# Patient Record
Sex: Female | Born: 1995 | Race: White | Hispanic: No | Marital: Single | Smoking: Never smoker
Health system: Southern US, Community
[De-identification: ages and names within clinical notes are randomized; demographics above are authoritative.]

## PROBLEM LIST (undated history)

## (undated) DIAGNOSIS — G43909 Migraine, unspecified, not intractable, without status migrainosus: Secondary | ICD-10-CM

## (undated) HISTORY — PX: NO PAST SURGERIES: SHX2092

---
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
## 2017-10-24 ENCOUNTER — Other Ambulatory Visit: Payer: Self-pay

## 2017-10-24 ENCOUNTER — Ambulatory Visit
Admission: EM | Admit: 2017-10-24 | Discharge: 2017-10-24 | Disposition: A | Discharge status: Home / Self Care | Payer: BC Managed Care – PPO | Attending: Family Medicine | Admitting: Family Medicine

## 2017-10-24 DIAGNOSIS — H1031 Unspecified acute conjunctivitis, right eye: Secondary | ICD-10-CM | POA: Diagnosis not present

## 2017-10-24 MED ORDER — MOXIFLOXACIN HCL 0.5 % OP SOLN: 1.0000 [drp] | Freq: Three times a day (TID) | OPHTHALMIC | 0 refills | Status: DC

## 2017-10-24 NOTE — ED Triage Notes (Signed)
Patient complains of right eye redness and drainage that started 2 days ago. Patient reports that she recently used a new pair of contacts and forgot to take them out and fell asleep in them and started noticing the blurry vision. Patient states that she took the contacts out and the vision has improved some.

## 2017-10-24 NOTE — ED Provider Notes (Signed)
[facility]    CSN: [id number] Arrival date & time: 10/24/17  1556     History   Chief Complaint Chief Complaint  Patient presents with  . Eye Pain    HPI Jade Chen is a 22 y.o. female.   22 yo female with a 2 days h/o right eye redness and drainage that started after she slept with her contact lenses overnight 3 days ago. Denies any pain. States today felt slightly better.    The history is provided by the patient.  Eye Pain     History reviewed. No pertinent past medical history.  There are no active problems to display for this patient.   Past Surgical History:  Procedure Laterality Date  . NO PAST SURGERIES      OB History    No data available       Home Medications    Prior to Admission medications   Medication Sig Start Date End Date Taking? Authorizing Provider  moxifloxacin (VIGAMOX) 0.5 % ophthalmic solution Place 1 drop into the right eye 3 (three) times daily. 10/24/17   Payton Mccallum, MD    Family History History reviewed. No pertinent family history.  Social History Social History   Tobacco Use  . Smoking status: Never Smoker  . Smokeless tobacco: Never Used  Substance Use Topics  . Alcohol use: Yes    Comment: occasionally  . Drug use: No     Allergies   Patient has no known allergies.   Review of Systems Review of Systems  Eyes: Positive for pain.     Physical Exam Triage Vital Signs ED Triage Vitals  Enc Vitals Group     BP 10/24/17 1652 (!) 119/57     Pulse Rate 10/24/17 1652 70     Resp 10/24/17 1652 18     Temp 10/24/17 1652 98.5 F (36.9 C)     Temp Source 10/24/17 1652 Oral     SpO2 10/24/17 1652 100 %     Weight 10/24/17 1650 117 lb (53.1 kg)     Height 10/24/17 1650 5\' 7"  (1.702 m)     Head Circumference --      Peak Flow --      Pain Score 10/24/17 1650 0     Pain Loc --      Pain Edu? --      Excl. in GC? --    No data found.  Updated Vital Signs BP (!) 119/57 (BP Location:  Right Arm)   Pulse 70   Temp 98.5 F (36.9 C) (Oral)   Resp 18   Ht 5\' 7"  (1.702 m)   Wt 117 lb (53.1 kg)   LMP 10/17/2017   SpO2 100%   BMI 18.32 kg/m   Visual Acuity Right Eye Distance: 20/15(corrected) Left Eye Distance: 20/15(corrected) Bilateral Distance: 20/13(corrected)  Right Eye Near:   Left Eye Near:    Bilateral Near:     Physical Exam  Constitutional: She appears well-developed and well-nourished. No distress.  Eyes: EOM and lids are normal. Pupils are equal, round, and reactive to light. Lids are everted and swept, no foreign bodies found. Right eye exhibits discharge. No foreign body present in the right eye. Right conjunctiva is injected.  Skin: She is not diaphoretic.  Nursing note and vitals reviewed.    UC Treatments / Results  Labs (all labs ordered are listed, but only abnormal results are displayed) Labs Reviewed - No data to display  EKG  EKG  Interpretation None       Radiology No results found.  Procedures Procedures (including critical care time)  Medications Ordered in UC Medications - No data to display   Initial Impression / Assessment and Plan / UC Course  I have reviewed the triage vital signs and the nursing notes.  Pertinent labs & imaging results that were available during my care of the patient were reviewed by me and considered in my medical decision making (see chart for details).       Final Clinical Impressions(s) / UC Diagnoses   Final diagnoses:  Acute bacterial conjunctivitis of right eye    ED Discharge Orders        Ordered    moxifloxacin (VIGAMOX) 0.5 % ophthalmic solution  3 times daily     10/24/17 1800     1. diagnosis reviewed with patient 2. rx as per orders above; reviewed possible side effects, interactions, risks and benefits  3. Recommend supportive treatment with cool compresses; do not wear contact lenses until resolved 4. Follow-up prn if symptoms worsen or don't improve   Controlled  Substance Prescriptions [place] Controlled Substance Registry consulted? Not Applicable   Payton Mccallumonty, Evangelene Vora, MD 10/24/17 [phone]

## 2021-04-14 DIAGNOSIS — G43009 Migraine without aura, not intractable, without status migrainosus: Secondary | ICD-10-CM | POA: Insufficient documentation

## 2021-10-07 ENCOUNTER — Other Ambulatory Visit: Payer: Self-pay | Admitting: Physician Assistant

## 2021-10-07 DIAGNOSIS — R519 Headache, unspecified: Secondary | ICD-10-CM

## 2021-10-07 DIAGNOSIS — M542 Cervicalgia: Secondary | ICD-10-CM

## 2021-10-15 ENCOUNTER — Ambulatory Visit: Payer: BC Managed Care – PPO

## 2021-10-28 ENCOUNTER — Ambulatory Visit
Admission: RE | Admit: 2021-10-28 | Discharge: 2021-10-28 | Disposition: A | Discharge status: Home / Self Care | Payer: BC Managed Care – PPO | Source: Ambulatory Visit | Attending: Physician Assistant | Admitting: Physician Assistant

## 2021-10-28 DIAGNOSIS — R519 Headache, unspecified: Secondary | ICD-10-CM

## 2021-10-28 DIAGNOSIS — M542 Cervicalgia: Secondary | ICD-10-CM

## 2021-10-28 IMAGING — MR MR MRA NECK W/O CM
2 series · 38 of 48 positions shown · non-contrast
Comparison: No pertinent prior exams available for comparison.

CLINICAL DATA: Throbbing headache. Non intractable headache,
unspecified chronicity pattern, unspecified headache type. Neck
pain. Additional history provided by scanning technologist: Patient
reports frequent headaches/migraines since [DATE], associated
sensitivity to light/sounds and smells.

EXAM:
MRI HEAD WITHOUT CONTRAST
MRA HEAD WITHOUT CONTRAST
MRA NECK WITHOUT CONTRAST
TECHNIQUE: Multiplanar, multi-echo pulse sequences of the brain and surrounding
structures were acquired without intravenous contrast. Angiographic
images of the Circle of Willis were acquired using MRA technique
without intravenous contrast. Angiographic images of the neck were
acquired using MRA technique without intravenous contrast. Carotid
stenosis measurements (when applicable) are obtained utilizing
NASCET criteria, using the distal internal carotid diameter as the
denominator.

[Series 5: tof_2d neck non · axial · 3.0mm · 0.65mm/px · z∈[-252,-53]mm · 36 of 105 slices shown]
[im 1/105]
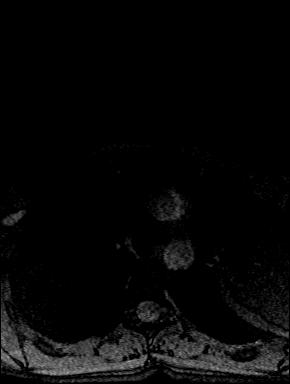
[im 3/105]
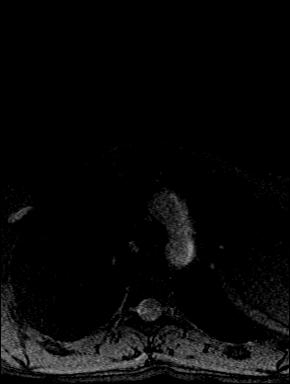
[im 5/105]
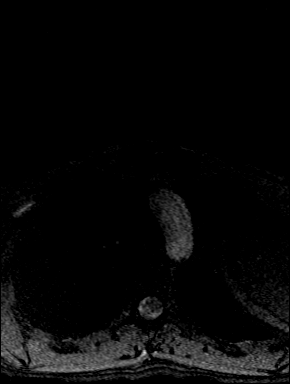
[im 7/105]
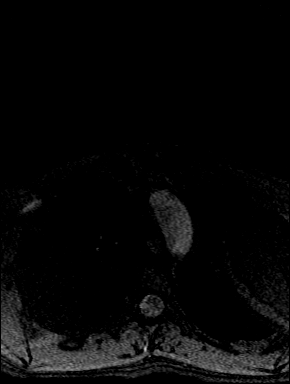
[im 10/105]
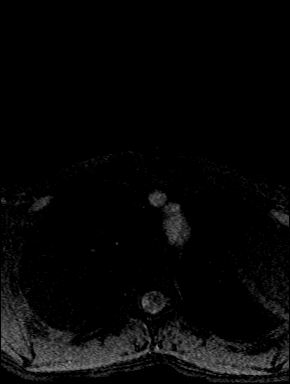
[im 12/105]
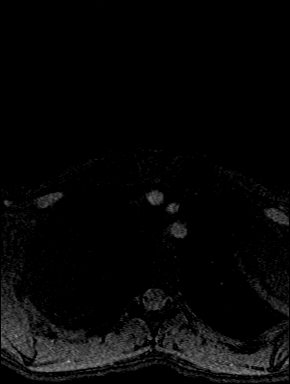
[im 14/105]
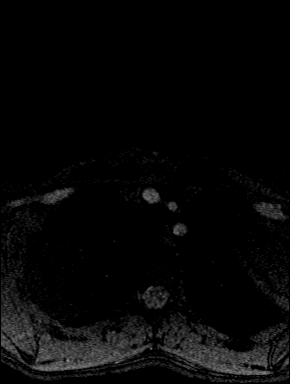
[im 17/105]
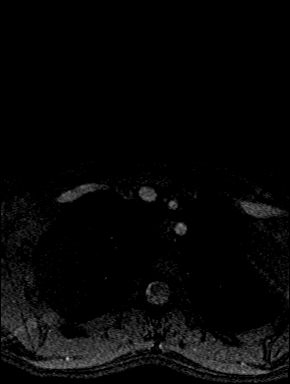
[im 19/105]
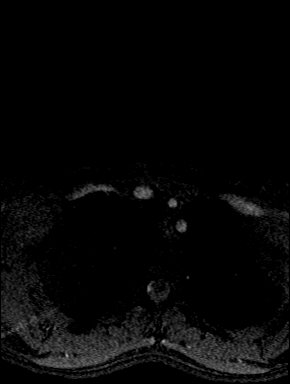
[im 21/105]
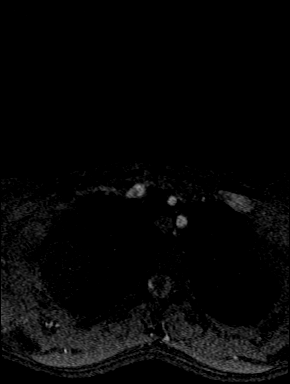
[im 24/105]
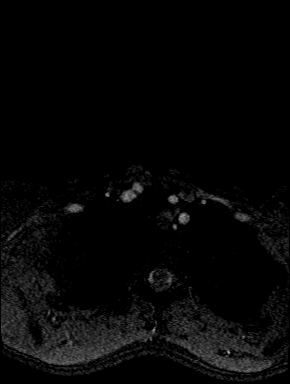
[im 26/105]
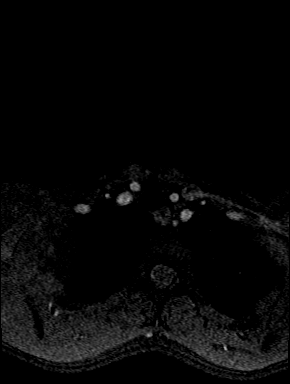
[im 28/105]
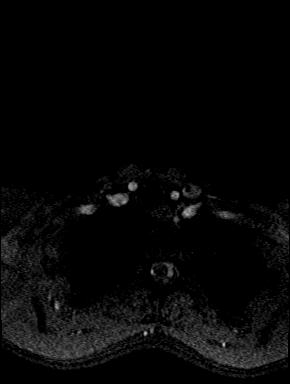
[im 31/105]
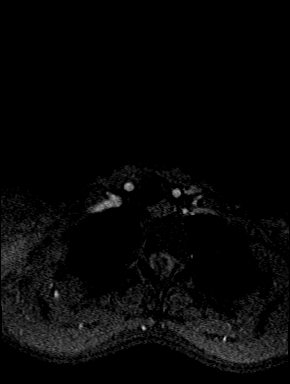
[im 33/105]
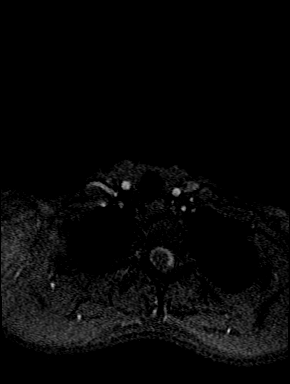
[im 35/105]
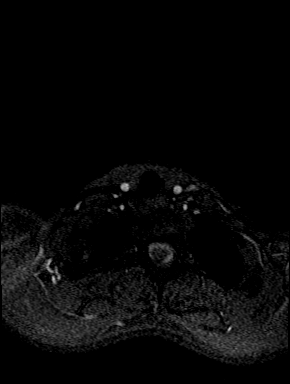
[im 37/105]
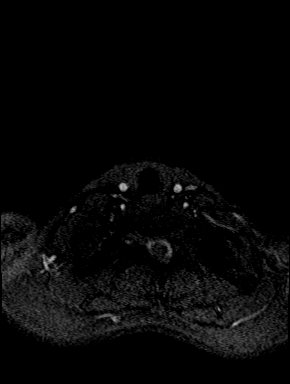
[im 40/105]
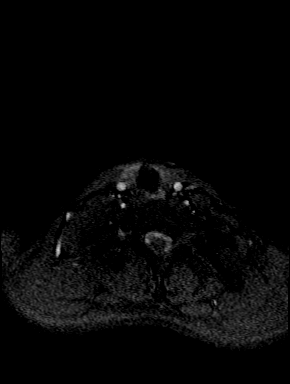
[im 42/105]
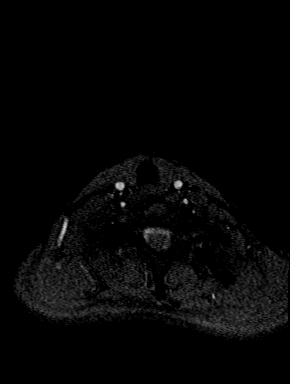
[im 44/105]
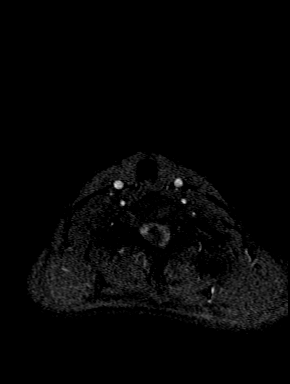
[im 47/105]
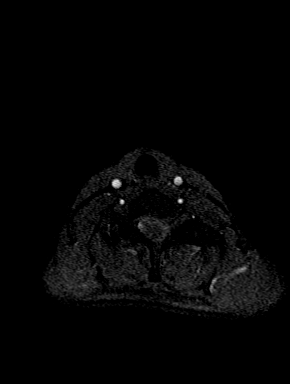
[im 49/105]
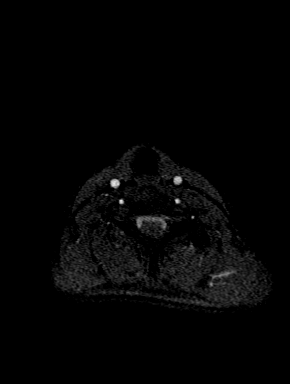
[im 51/105]
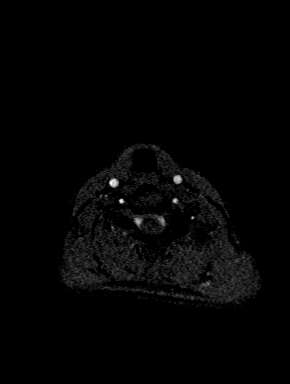
[im 54/105]
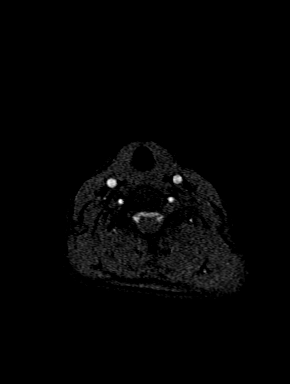
[im 56/105]
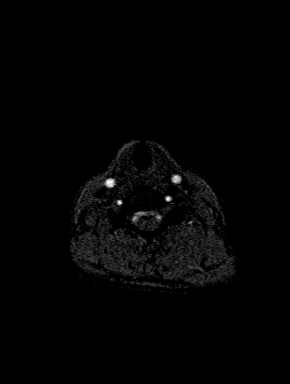
[im 58/105]
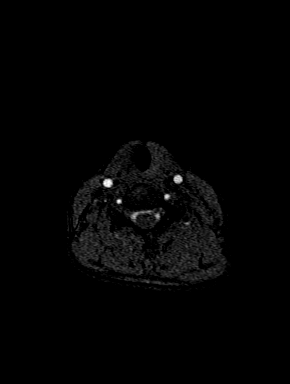
[im 61/105]
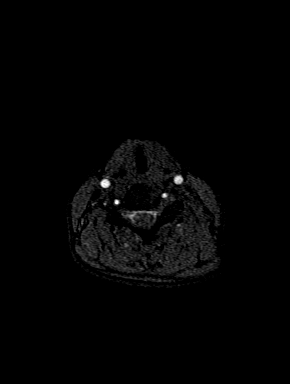
[im 63/105]
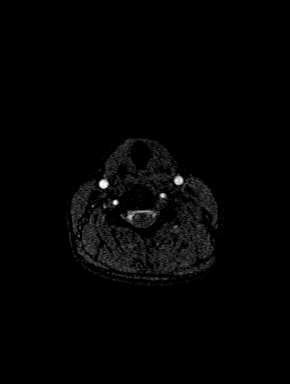
[im 65/105]
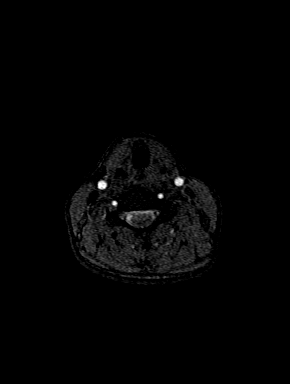
[im 68/105]
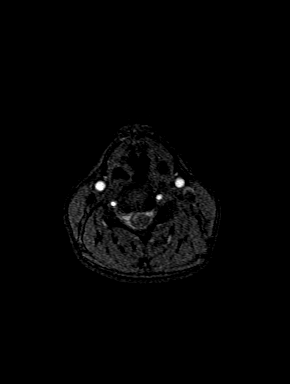
[im 70/105]
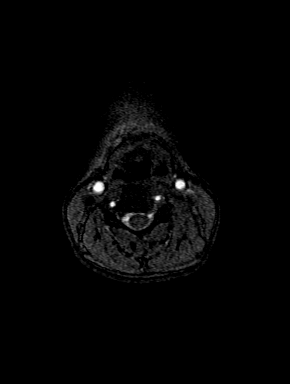
[im 72/105]
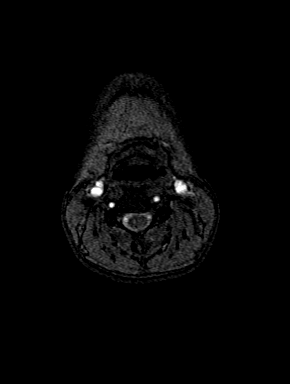
[im 74/105]
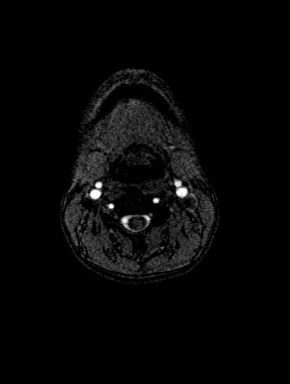
[im 86/105]
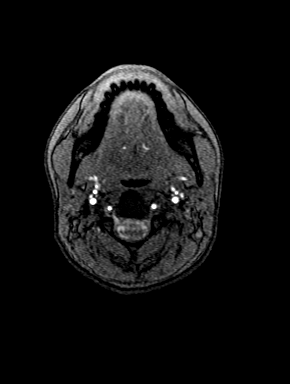
[im 88/105]
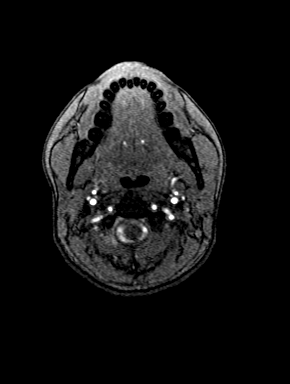
[im 100/105]
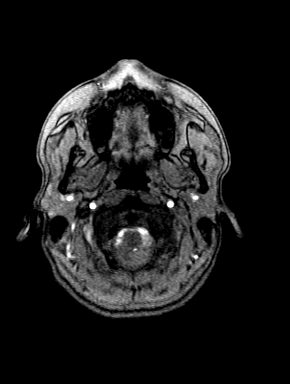

[Series 102: lcca · sagittal · 0.65mm/px · 2 of 4 slices shown]
[im 1/4]
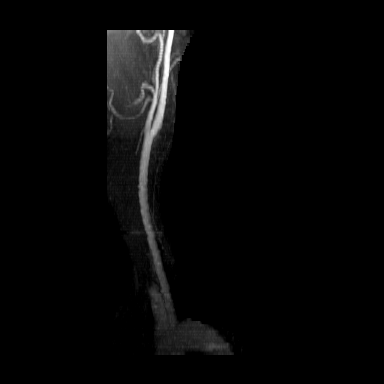
[im 4/4]
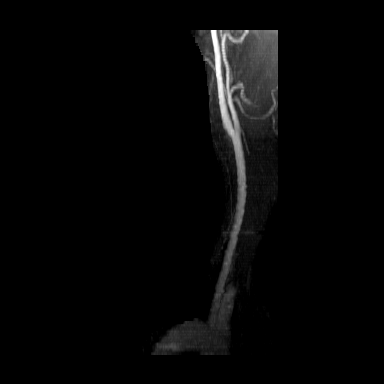

[38 of 48 positions shown; findings below may reference images not displayed]

FINDINGS: MRI HEAD FINDINGS

Brain:

Cerebral volume is normal.

2 mm cystic-appearing focus within the midline posterior pituitary
gland without mass effect or invasion of regional structures (series
11, image 11) (series 16, image 19).

Nonspecific 3 mm round focus of SWI signal loss within the central
pons.

No cortical encephalomalacia is identified. No significant cerebral
white matter disease.

There is no acute infarct.

No evidence of an intracranial mass.

No extra-axial fluid collection.

No midline shift.

Vascular: Maintained flow voids within the proximal large arterial
vessels.

Skull and upper cervical spine: No focal suspicious marrow lesion.

Sinuses/Orbits: Visualized orbits show no acute finding. Trace
scattered paranasal sinus mucosal thickening.

MRA HEAD FINDINGS

Anterior circulation:

The intracranial internal carotid arteries are patent. The M1 middle
cerebral arteries are patent. No M2 proximal branch occlusion or
high-grade proximal stenosis is identified. The anterior cerebral
arteries are patent. No intracranial aneurysm is identified.

Posterior circulation:

The intracranial vertebral arteries are patent. The basilar artery
is patent. The posterior cerebral arteries are patent. Fetal origin
right posterior communicating artery. A left posterior communicating
artery is present.

Anatomic variants: As described.

MRA NECK FINDINGS

Aortic arch: Standard aortic branching. The visualized aortic arch
is normal in caliber.

Right carotid system: The common carotid and internal carotid
arteries are smooth and patent within the neck without appreciable
stenosis.

Left carotid system: The common carotid and internal carotid
arteries are smooth and patent within the neck without appreciable
stenosis.

Vertebral arteries: Codominant and patent within the neck. The
vertebral arteries are smooth without appreciable stenosis.
IMPRESSION: MRI brain:

1. No evidence of acute intracranial abnormality.
2. 3 mm round focus of susceptibility-weighted signal loss within
the central pons, nonspecific but possibly reflecting a chronic
microhemorrhage, focus of mineralization or a low-flow vascular
malformation (such as capillary telangiectasia).
3. 2 mm cyst within the midline posterior pituitary gland. No
further imaging evaluation is necessary. This follows ACR consensus
guidelines: Management of Incidental Pituitary Findings on CT, MRI
and F18-FDG PET: A White Paper of the ACR Incidental Findings
Committee. [HOSPITAL] [XO]; 15: 966-72.
4. Otherwise unremarkable non-contrast MRI appearance of the brain.

MRA head:

1. Unremarkable exam. No intracranial large vessel occlusion or
proximal high-grade arterial stenosis.
2. No intracranial aneurysm identified.

MRA neck:

The common carotid, internal carotid and vertebral arteries are
patent within the neck without appreciable stenosis.

## 2021-10-28 IMAGING — MR MR HEAD W/O CM
11 series · 48 of 48 positions shown · non-contrast
Comparison: No pertinent prior exams available for comparison.

CLINICAL DATA: Throbbing headache. Non intractable headache,
unspecified chronicity pattern, unspecified headache type. Neck
pain. Additional history provided by scanning technologist: Patient
reports frequent headaches/migraines since [DATE], associated
sensitivity to light/sounds and smells.

EXAM:
MRI HEAD WITHOUT CONTRAST
MRA HEAD WITHOUT CONTRAST
MRA NECK WITHOUT CONTRAST
TECHNIQUE: Multiplanar, multi-echo pulse sequences of the brain and surrounding
structures were acquired without intravenous contrast. Angiographic
images of the Circle of Willis were acquired using MRA technique
without intravenous contrast. Angiographic images of the neck were
acquired using MRA technique without intravenous contrast. Carotid
stenosis measurements (when applicable) are obtained utilizing
NASCET criteria, using the distal internal carotid diameter as the
denominator.

[Series 5: T1 · sagittal · 4.0mm · 0.75mm/px · 2 of 31 slices shown (1 of 2)]
[im 1/31]
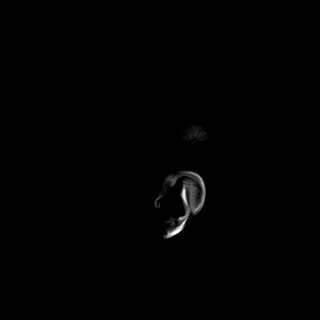
[im 31/31]
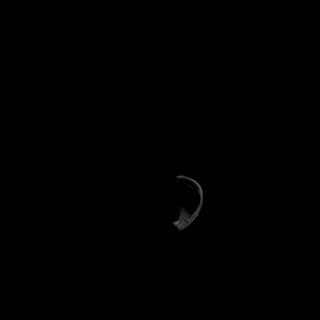

[Series 6: DWI · axial · 3.0mm · 0.94mm/px · z∈[-67,+73]mm · 11 of 160 slices shown (1 of 3)]
[im 1/160]
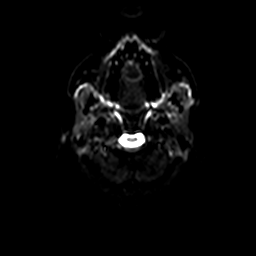
[im 16/160]
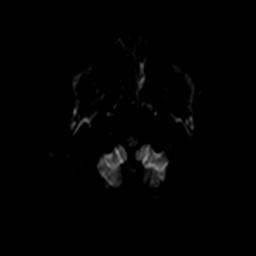
[im 32/160]
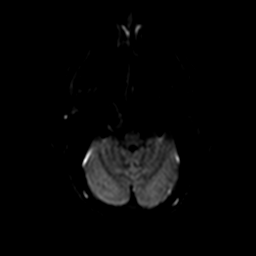
[im 48/160]
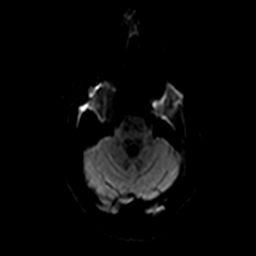
[im 64/160]
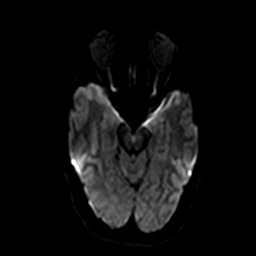
[im 80/160]
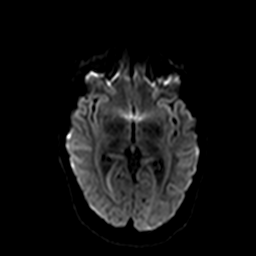
[im 96/160]
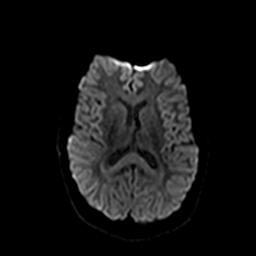
[im 112/160]
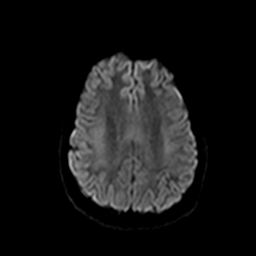
[im 128/160]
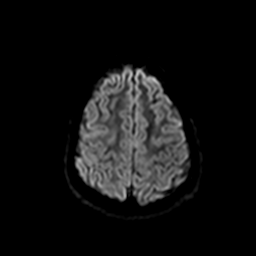
[im 144/160]
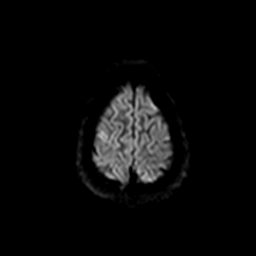
[im 160/160]
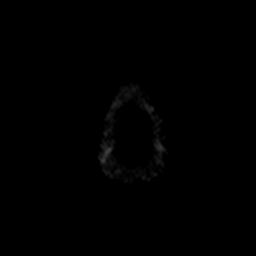

[Series 7: ax dwi_tracew · axial · 3.0mm · 0.94mm/px · z∈[-67,+73]mm · 6 of 80 slices shown]
[im 1/80]
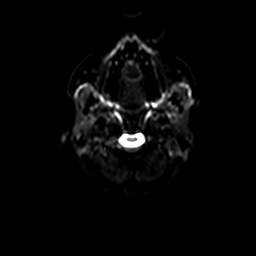
[im 16/80]
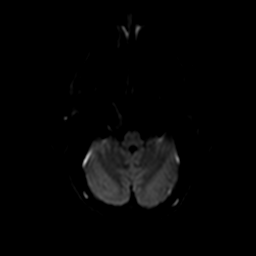
[im 32/80]
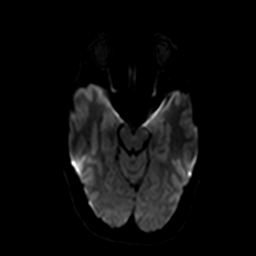
[im 48/80]
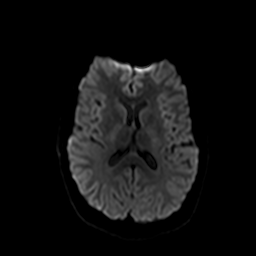
[im 64/80]
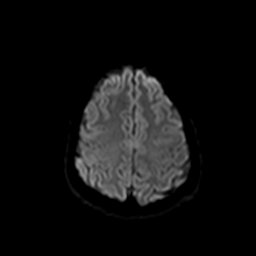
[im 80/80]
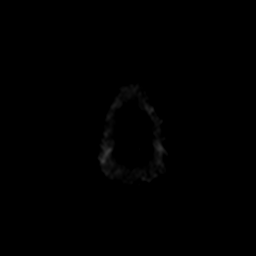

[Series 8: ax dwi_adc · axial · 3.0mm · 0.94mm/px · z∈[-67,+73]mm · 3 of 40 slices shown]
[im 1/40]
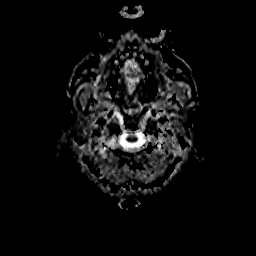
[im 20/40]
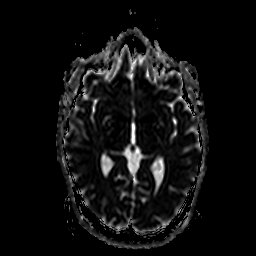
[im 40/40]
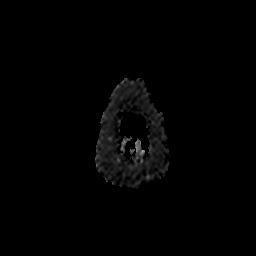

[Series 9: DWI · coronal · 5.0mm · 1.44mm/px · 4 of 60 slices shown (2 of 3)]
[im 1/60]
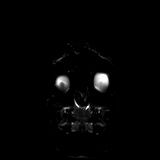
[im 20/60]
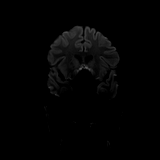
[im 40/60]
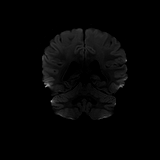
[im 60/60]
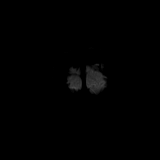

[Series 10: DWI · coronal · 5.0mm · 1.44mm/px · 2 of 30 slices shown (3 of 3)]
[im 1/30]
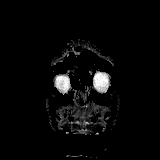
[im 30/30]
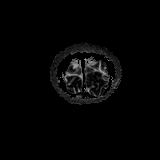

[Series 11: T2 · axial · 4.0mm · 0.36mm/px · z∈[-68,+77]mm · 2 of 29 slices shown (1 of 2)]
[im 1/29]
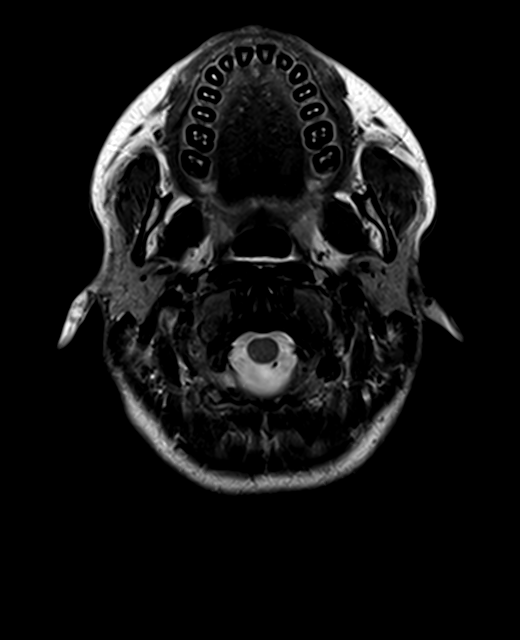
[im 29/29]
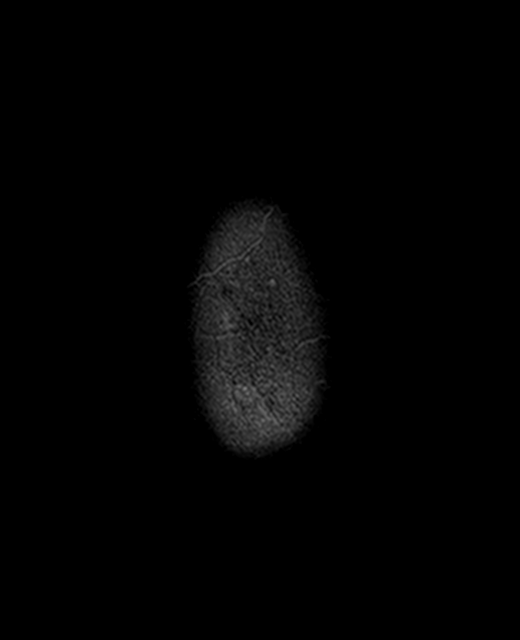

[Series 12: FLAIR · axial · 3.0mm · 0.72mm/px · z∈[-71,+78]mm · 2 of 26 slices shown]
[im 1/26]
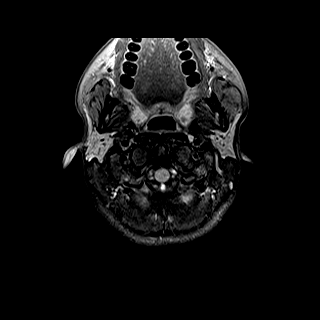
[im 26/26]
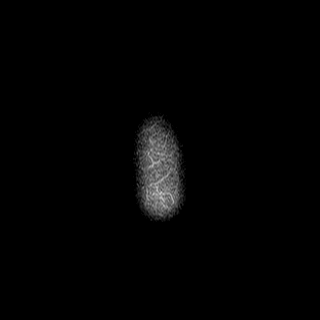

[Series 14: swi_images · axial · 3.0mm · 0.90mm/px · z∈[-72,+81]mm · 4 of 52 slices shown]
[im 1/52]
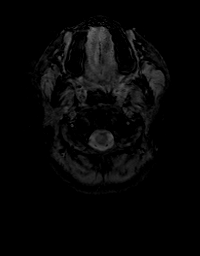
[im 18/52]
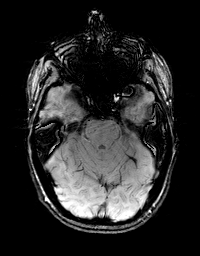
[im 35/52]
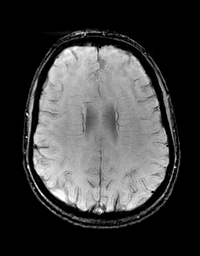
[im 52/52]
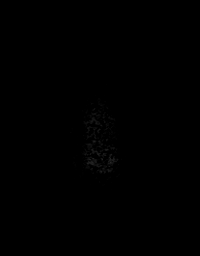

[Series 15: T1 · axial · 1.0mm · 0.90mm/px · z∈[-69,+73]mm · 10 of 144 slices shown (2 of 2)]
[im 1/144]
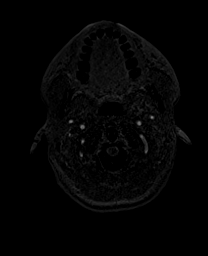
[im 16/144]
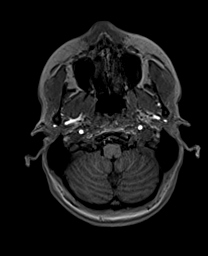
[im 32/144]
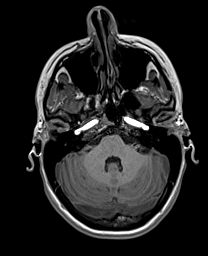
[im 48/144]
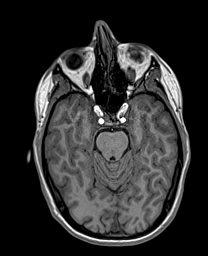
[im 64/144]
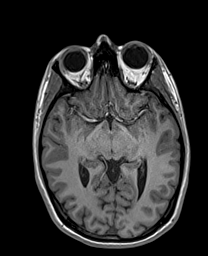
[im 80/144]
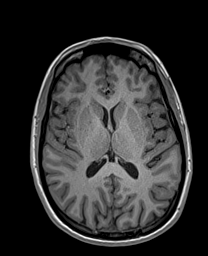
[im 96/144]
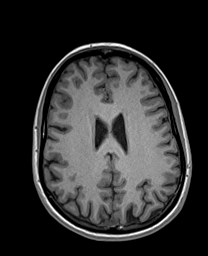
[im 112/144]
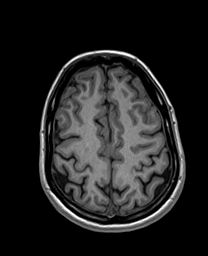
[im 128/144]
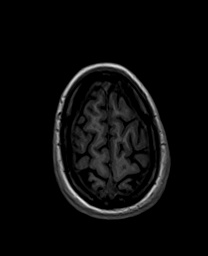
[im 144/144]
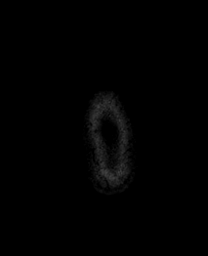

[Series 16: T2 · coronal · 4.5mm · 0.36mm/px · 2 of 30 slices shown (2 of 2)]
[im 1/30]
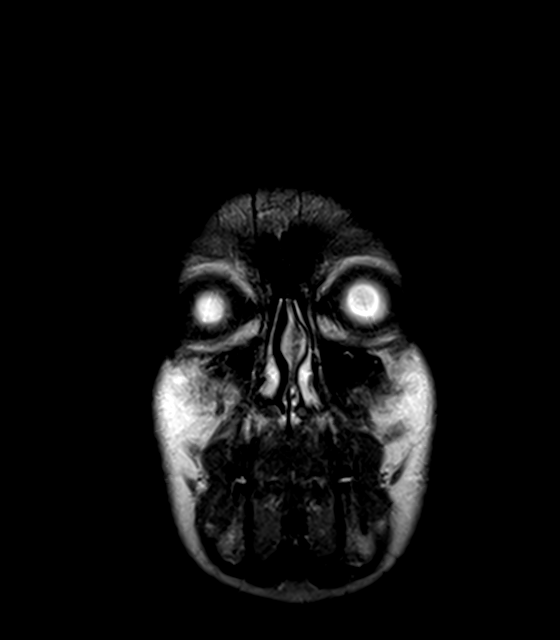
[im 30/30]
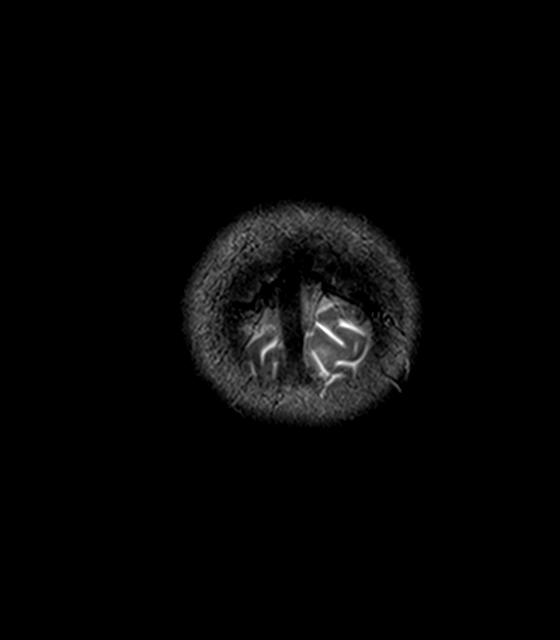

[48 of 48 positions shown; findings below may reference images not displayed]

FINDINGS: MRI HEAD FINDINGS

Brain:

Cerebral volume is normal.

2 mm cystic-appearing focus within the midline posterior pituitary
gland without mass effect or invasion of regional structures (series
11, image 11) (series 16, image 19).

Nonspecific 3 mm round focus of SWI signal loss within the central
pons.

No cortical encephalomalacia is identified. No significant cerebral
white matter disease.

There is no acute infarct.

No evidence of an intracranial mass.

No extra-axial fluid collection.

No midline shift.

Vascular: Maintained flow voids within the proximal large arterial
vessels.

Skull and upper cervical spine: No focal suspicious marrow lesion.

Sinuses/Orbits: Visualized orbits show no acute finding. Trace
scattered paranasal sinus mucosal thickening.

MRA HEAD FINDINGS

Anterior circulation:

The intracranial internal carotid arteries are patent. The M1 middle
cerebral arteries are patent. No M2 proximal branch occlusion or
high-grade proximal stenosis is identified. The anterior cerebral
arteries are patent. No intracranial aneurysm is identified.

Posterior circulation:

The intracranial vertebral arteries are patent. The basilar artery
is patent. The posterior cerebral arteries are patent. Fetal origin
right posterior communicating artery. A left posterior communicating
artery is present.

Anatomic variants: As described.

MRA NECK FINDINGS

Aortic arch: Standard aortic branching. The visualized aortic arch
is normal in caliber.

Right carotid system: The common carotid and internal carotid
arteries are smooth and patent within the neck without appreciable
stenosis.

Left carotid system: The common carotid and internal carotid
arteries are smooth and patent within the neck without appreciable
stenosis.

Vertebral arteries: Codominant and patent within the neck. The
vertebral arteries are smooth without appreciable stenosis.
IMPRESSION: MRI brain:

1. No evidence of acute intracranial abnormality.
2. 3 mm round focus of susceptibility-weighted signal loss within
the central pons, nonspecific but possibly reflecting a chronic
microhemorrhage, focus of mineralization or a low-flow vascular
malformation (such as capillary telangiectasia).
3. 2 mm cyst within the midline posterior pituitary gland. No
further imaging evaluation is necessary. This follows ACR consensus
guidelines: Management of Incidental Pituitary Findings on CT, MRI
and F18-FDG PET: A White Paper of the ACR Incidental Findings
Committee. [HOSPITAL] [XO]; 15: 966-72.
4. Otherwise unremarkable non-contrast MRI appearance of the brain.

MRA head:

1. Unremarkable exam. No intracranial large vessel occlusion or
proximal high-grade arterial stenosis.
2. No intracranial aneurysm identified.

MRA neck:

The common carotid, internal carotid and vertebral arteries are
patent within the neck without appreciable stenosis.

## 2021-10-28 IMAGING — MR MR MRA HEAD W/O CM
1 series · 10 of 48 positions shown · non-contrast
Comparison: No pertinent prior exams available for comparison.

CLINICAL DATA: Throbbing headache. Non intractable headache,
unspecified chronicity pattern, unspecified headache type. Neck
pain. Additional history provided by scanning technologist: Patient
reports frequent headaches/migraines since [DATE], associated
sensitivity to light/sounds and smells.

EXAM:
MRI HEAD WITHOUT CONTRAST
MRA HEAD WITHOUT CONTRAST
MRA NECK WITHOUT CONTRAST
TECHNIQUE: Multiplanar, multi-echo pulse sequences of the brain and surrounding
structures were acquired without intravenous contrast. Angiographic
images of the Circle of Willis were acquired using MRA technique
without intravenous contrast. Angiographic images of the neck were
acquired using MRA technique without intravenous contrast. Carotid
stenosis measurements (when applicable) are obtained utilizing
NASCET criteria, using the distal internal carotid diameter as the
denominator.

[Series 4: tof_fl3d_tra_p2_multi-slab · axial · 0.6mm · 0.26mm/px · z∈[-51,+19]mm · 10 of 149 slices shown]
[im 10/149]
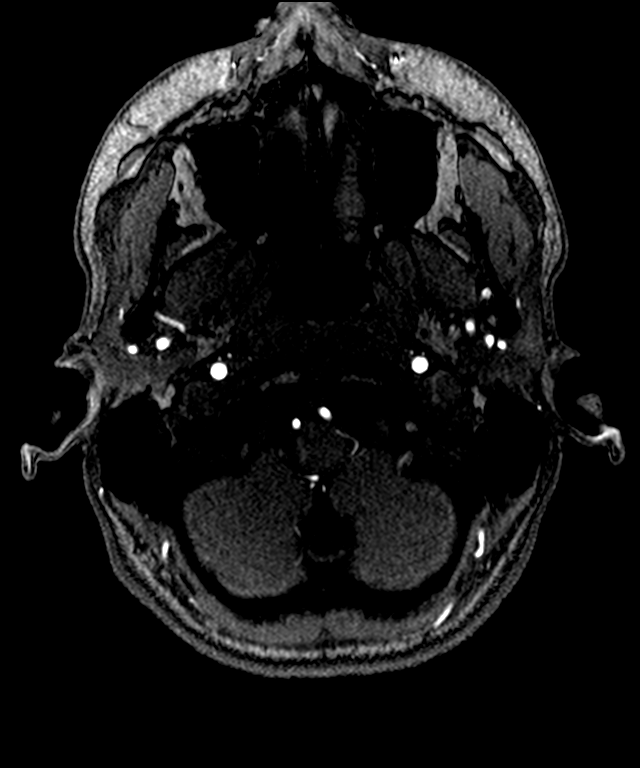
[im 26/149]
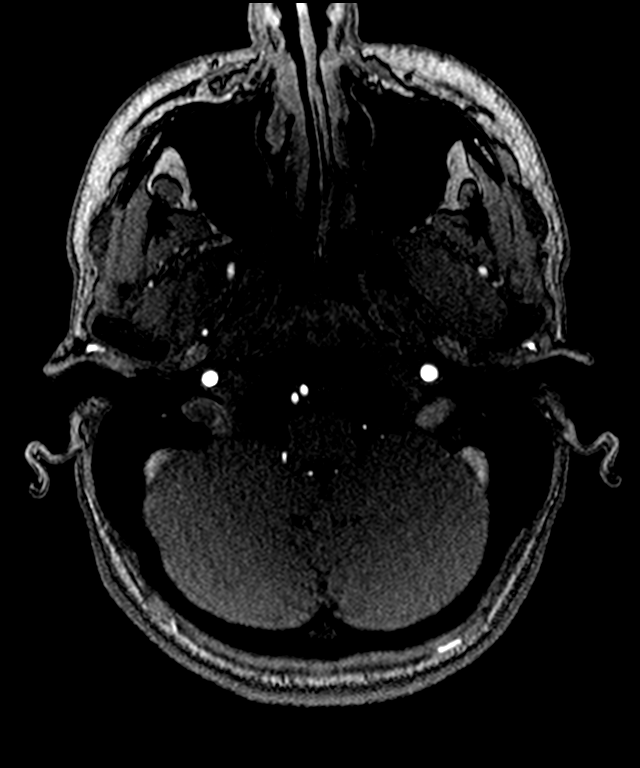
[im 29/149]
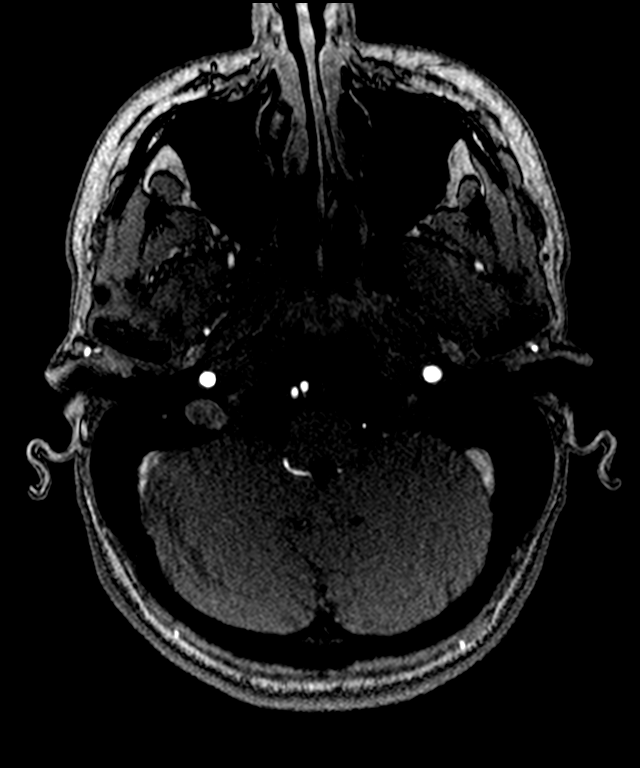
[im 48/149]
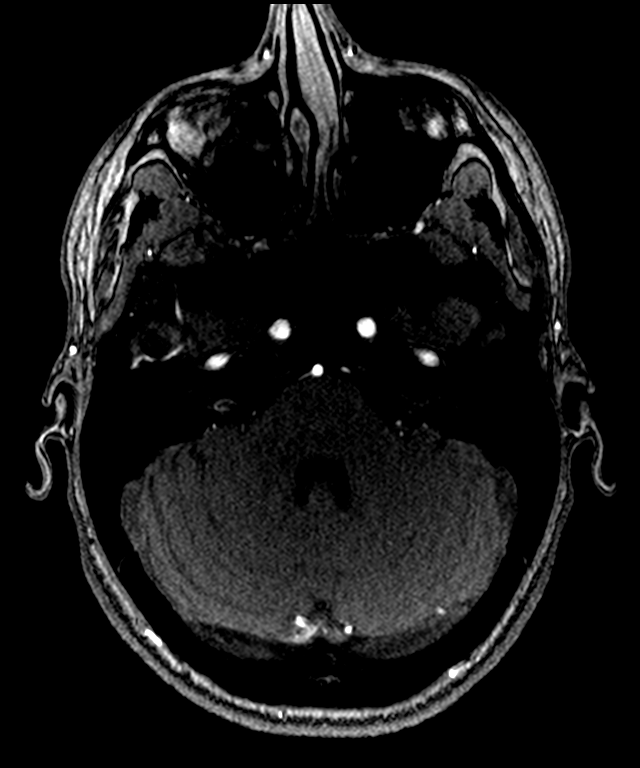
[im 67/149]
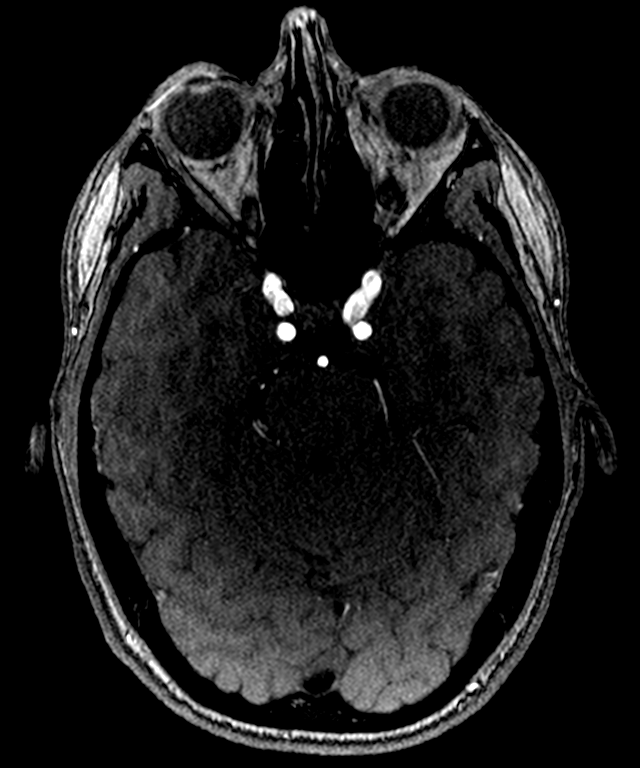
[im 76/149]
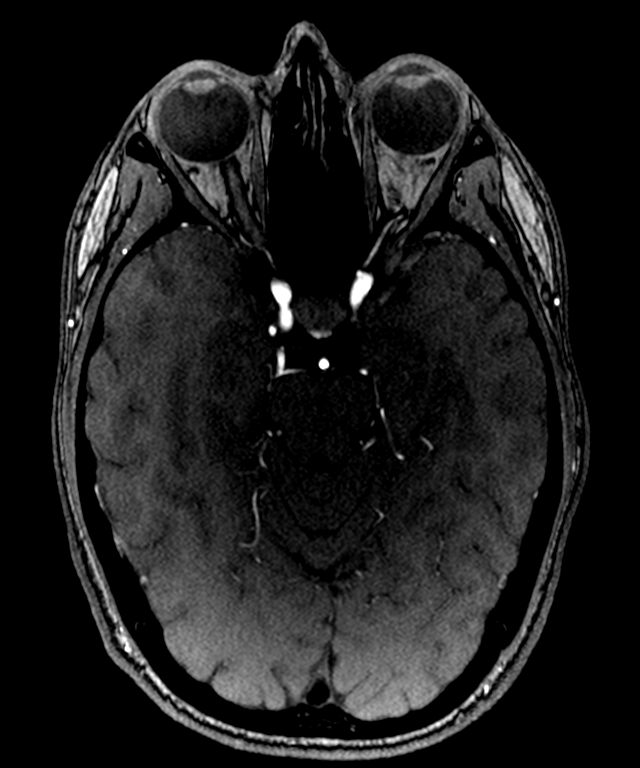
[im 86/149]
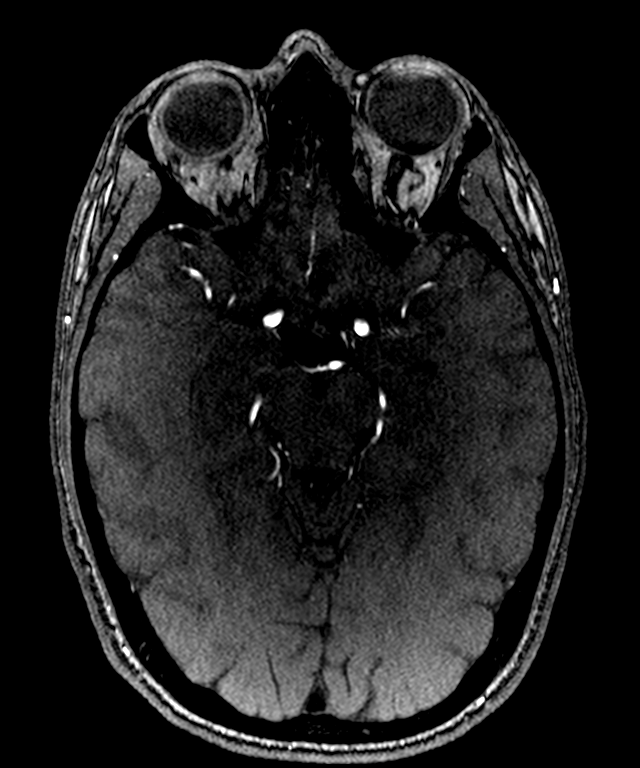
[im 104/149]
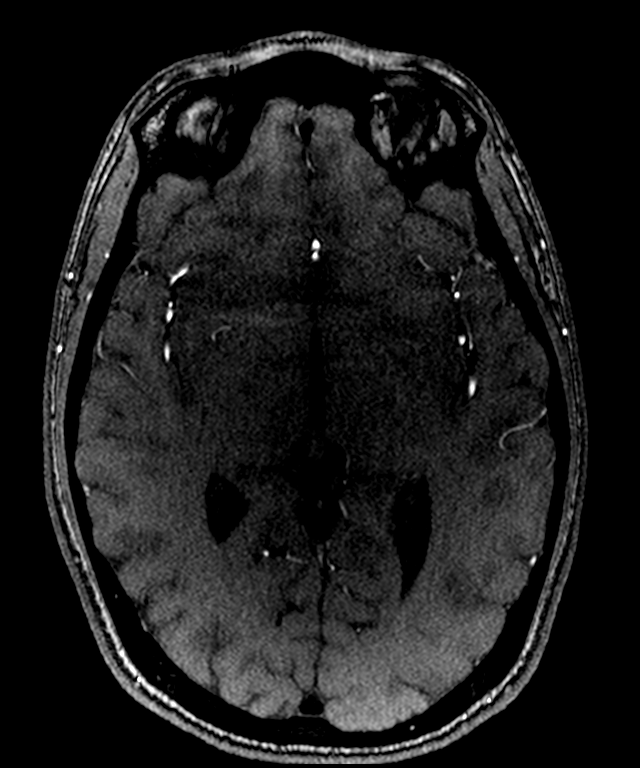
[im 123/149]
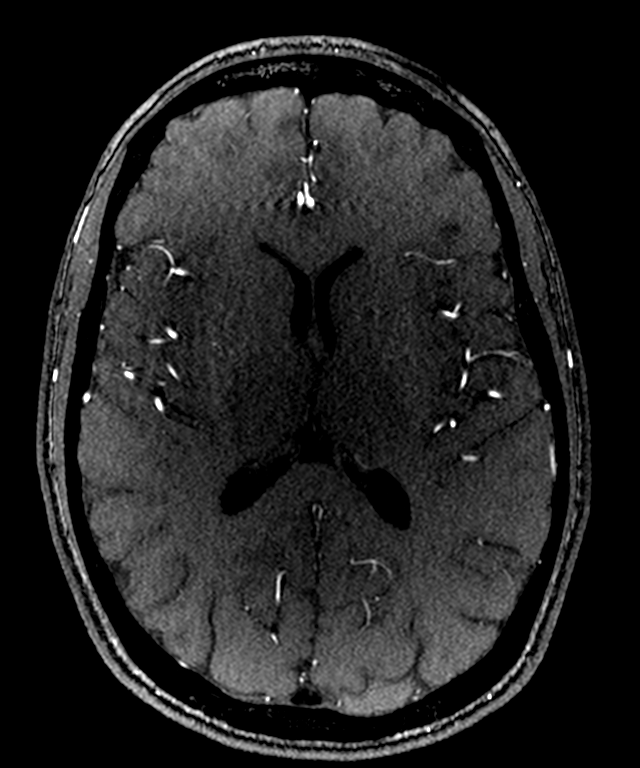
[im 126/149]
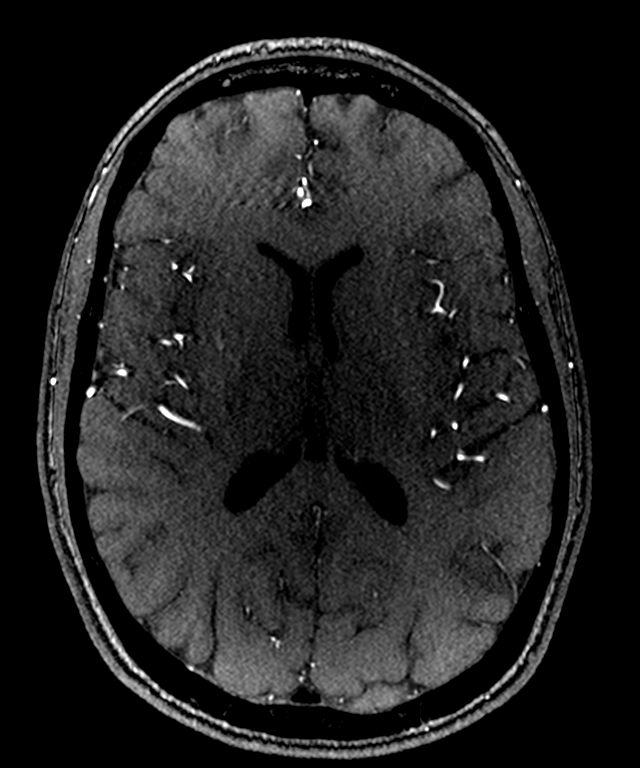

[10 of 48 positions shown; findings below may reference images not displayed]

FINDINGS: MRI HEAD FINDINGS

Brain:

Cerebral volume is normal.

2 mm cystic-appearing focus within the midline posterior pituitary
gland without mass effect or invasion of regional structures (series
11, image 11) (series 16, image 19).

Nonspecific 3 mm round focus of SWI signal loss within the central
pons.

No cortical encephalomalacia is identified. No significant cerebral
white matter disease.

There is no acute infarct.

No evidence of an intracranial mass.

No extra-axial fluid collection.

No midline shift.

Vascular: Maintained flow voids within the proximal large arterial
vessels.

Skull and upper cervical spine: No focal suspicious marrow lesion.

Sinuses/Orbits: Visualized orbits show no acute finding. Trace
scattered paranasal sinus mucosal thickening.

MRA HEAD FINDINGS

Anterior circulation:

The intracranial internal carotid arteries are patent. The M1 middle
cerebral arteries are patent. No M2 proximal branch occlusion or
high-grade proximal stenosis is identified. The anterior cerebral
arteries are patent. No intracranial aneurysm is identified.

Posterior circulation:

The intracranial vertebral arteries are patent. The basilar artery
is patent. The posterior cerebral arteries are patent. Fetal origin
right posterior communicating artery. A left posterior communicating
artery is present.

Anatomic variants: As described.

MRA NECK FINDINGS

Aortic arch: Standard aortic branching. The visualized aortic arch
is normal in caliber.

Right carotid system: The common carotid and internal carotid
arteries are smooth and patent within the neck without appreciable
stenosis.

Left carotid system: The common carotid and internal carotid
arteries are smooth and patent within the neck without appreciable
stenosis.

Vertebral arteries: Codominant and patent within the neck. The
vertebral arteries are smooth without appreciable stenosis.
IMPRESSION: MRI brain:

1. No evidence of acute intracranial abnormality.
2. 3 mm round focus of susceptibility-weighted signal loss within
the central pons, nonspecific but possibly reflecting a chronic
microhemorrhage, focus of mineralization or a low-flow vascular
malformation (such as capillary telangiectasia).
3. 2 mm cyst within the midline posterior pituitary gland. No
further imaging evaluation is necessary. This follows ACR consensus
guidelines: Management of Incidental Pituitary Findings on CT, MRI
and F18-FDG PET: A White Paper of the ACR Incidental Findings
Committee. [HOSPITAL] [XO]; 15: 966-72.
4. Otherwise unremarkable non-contrast MRI appearance of the brain.

MRA head:

1. Unremarkable exam. No intracranial large vessel occlusion or
proximal high-grade arterial stenosis.
2. No intracranial aneurysm identified.

MRA neck:

The common carotid, internal carotid and vertebral arteries are
patent within the neck without appreciable stenosis.

## 2021-11-09 ENCOUNTER — Other Ambulatory Visit
Admission: RE | Admit: 2021-11-09 | Discharge: 2021-11-09 | Disposition: A | Discharge status: Home / Self Care | Payer: BC Managed Care – PPO | Source: Ambulatory Visit | Attending: Student | Admitting: Student

## 2021-11-09 DIAGNOSIS — R0789 Other chest pain: Secondary | ICD-10-CM | POA: Diagnosis present

## 2021-11-09 LAB — D-DIMER, QUANTITATIVE: D-Dimer, Quant: <0.27 ug/mL-FEU (ref 0.00–0.50)

## 2022-08-24 ENCOUNTER — Other Ambulatory Visit: Payer: Self-pay | Admitting: Student

## 2022-08-24 DIAGNOSIS — R0789 Other chest pain: Secondary | ICD-10-CM

## 2022-08-26 ENCOUNTER — Ambulatory Visit
Admission: RE | Admit: 2022-08-26 | Discharge: 2022-08-26 | Disposition: A | Discharge status: Home / Self Care | Payer: Self-pay | Source: Ambulatory Visit | Attending: Student | Admitting: Student

## 2022-08-26 DIAGNOSIS — R0789 Other chest pain: Secondary | ICD-10-CM | POA: Insufficient documentation

## 2022-10-27 ENCOUNTER — Ambulatory Visit
Admission: RE | Admit: 2022-10-27 | Discharge: 2022-10-27 | Disposition: A | Discharge status: Home / Self Care | Payer: 59 | Source: Ambulatory Visit | Attending: Family Medicine | Admitting: Family Medicine

## 2022-10-27 VITALS — BP 117/78 | HR 63 | Temp 98.7°F | Resp 16 | Ht 67.0 in | Wt 120.0 lb

## 2022-10-27 DIAGNOSIS — N3001 Acute cystitis with hematuria: Secondary | ICD-10-CM

## 2022-10-27 DIAGNOSIS — N3000 Acute cystitis without hematuria: Secondary | ICD-10-CM | POA: Diagnosis not present

## 2022-10-27 DIAGNOSIS — R109 Unspecified abdominal pain: Secondary | ICD-10-CM | POA: Diagnosis not present

## 2022-10-27 HISTORY — DX: Migraine, unspecified, not intractable, without status migrainosus: G43.909

## 2022-10-27 LAB — URINALYSIS, W/ REFLEX TO CULTURE (INFECTION SUSPECTED)
Bilirubin Urine: NEGATIVE
Glucose, UA: NEGATIVE mg/dL
Ketones, ur: NEGATIVE mg/dL
Nitrite: NEGATIVE
Specific Gravity, Urine: 1.015 (ref 1.005–1.030)
pH: 7 (ref 5.0–8.0)

## 2022-10-27 NOTE — ED Triage Notes (Signed)
Pt c/o abd pain, states she got prescribed macrobid by [facility] due to urine cx coming back + on 1/30 but I have now developed intense pain on my right side. I am concerned that this could be kidney infection in addition to the UTI that may require separate/different treatment than the meds I was prescribed - Entered by patient

## 2022-10-27 NOTE — Discharge Instructions (Addendum)
You have a urinary tract infection. Continue taking your antibiotics.

## 2022-10-27 NOTE — ED Provider Notes (Signed)
[facility]    CSN: [id number] Arrival date & time: 10/27/22  1740      History   Chief Complaint Chief Complaint  Patient presents with   Abdominal Pain    I got prescribed medicine for a UTI on 1/30 but I have now developed intense pain on my right side. I am concerned that this could be kidney infection in addition to the UTI that may require separate/different treatment than the meds I was presc - Entered by patient   Flank Pain     HPI HPI Jade Chen is a 27 y.o. female.    Jade Chen presents for right lower side pain that started Monday night. She is currently being treated for a UTI.  She is not currently pregnant. Taking Macrobid prescribed by her GYN/OB. States she can only see a pregnancy test in MyChart.  States she Google'd her symptoms and is concerned she may be having a kidney infection therefore came to the urgent care.  There has been no fever, chills, nausea or vomiting. Patient's last menstrual period was 10/23/2022 (approximate).      Past Medical History:  Diagnosis Date   Migraine     There are no problems to display for this patient.   Past Surgical History:  Procedure Laterality Date   NO PAST SURGERIES      OB History   No obstetric history on file.      Home Medications    Prior to Admission medications   Medication Sig Start Date End Date Taking? Authorizing Provider  nitrofurantoin, macrocrystal-monohydrate, (MACROBID) 100 MG capsule Take by mouth. 10/26/22 10/31/22 Yes [provider]  norethindrone (MICRONOR) 0.35 MG tablet Take 1 tablet by mouth daily.   Yes [provider]  SUMAtriptan (IMITREX) 50 MG tablet Take by mouth.   Yes [provider]  moxifloxacin (VIGAMOX) 0.5 % ophthalmic solution Place 1 drop into the right eye 3 (three) times daily. 10/24/17   Norval Gable, MD    Family History History reviewed. No pertinent family history.  Social History Social History    Tobacco Use   Smoking status: Never   Smokeless tobacco: Never  Vaping Use   Vaping Use: Some days  Substance Use Topics   Alcohol use: Yes    Comment: occasionally   Drug use: No     Allergies   Patient has no known allergies.   Review of Systems Review of Systems: :negative unless otherwise stated in HPI.      Physical Exam Triage Vital Signs ED Triage Vitals  Enc Vitals Group     BP 10/27/22 1750 117/78     Pulse Rate 10/27/22 1750 63     Resp 10/27/22 1750 16     Temp 10/27/22 1750 98.7 F (37.1 C)     Temp Source 10/27/22 1750 Oral     SpO2 10/27/22 1750 98 %     Weight 10/27/22 1748 120 lb (54.4 kg)     Height 10/27/22 1748 5\' 7"  (1.702 m)     Head Circumference --      Peak Flow --      Pain Score 10/27/22 1748 5     Pain Loc --      Pain Edu? --      Excl. in [place]? --    No data found.  Updated Vital Signs BP 117/78 (BP Location: Left Arm)   Pulse 63   Temp 98.7 F (37.1 C) (Oral)  Resp 16   Ht 5\' 7"  (1.702 m)   Wt 54.4 kg   LMP 10/23/2022 (Approximate)   SpO2 98%   BMI 18.79 kg/m   Visual Acuity Right Eye Distance:   Left Eye Distance:   Bilateral Distance:    Right Eye Near:   Left Eye Near:    Bilateral Near:     Physical Exam GEN: well appearing female in no acute distress  CVS: well perfused, regular rate RESP: speaking in full sentences without pause  ABD: soft, non-tender, non-distended, no palpable masses, right CVA tenderness   SKIN: warm, dry, no flank overlying skin changes    UC Treatments / Results  Labs (all labs ordered are listed, but only abnormal results are displayed) Labs Reviewed  URINALYSIS, W/ REFLEX TO CULTURE (INFECTION SUSPECTED) - Abnormal; Notable for the following components:      Result Value   Hgb urine dipstick MODERATE (*)    Protein, ur TRACE (*)    Leukocytes,Ua TRACE (*)    Bacteria, UA FEW (*)    All other components within normal limits    EKG   Radiology No results  found.  Procedures Procedures (including critical care time)  Medications Ordered in UC Medications - No data to display  Initial Impression / Assessment and Plan / UC Course  I have reviewed the triage vital signs and the nursing notes.  Pertinent labs & imaging results that were available during my care of the patient were reviewed by me and considered in my medical decision making (see chart for details).      Patient is a 27 y.o.Marland Kitchen female  who presents for 2 days of urinary symptoms.  Overall patient is well-appearing and afebrile.  Vital signs stable. UA consistent with acute cystitis.  Hematuria supported on microscopy. Continue Macrobid. Urine culture didn't reflex as she has <10 WBCs.  Return precautions including abdominal pain, fever, chills, nausea, or vomiting given.   Discussed MDM, treatment plan and plan for follow-up with patient who agrees with plan.        Final Clinical Impressions(s) / UC Diagnoses   Final diagnoses:  Right flank pain  Acute cystitis with hematuria     Discharge Instructions      You have a urinary tract infection. Continue taking your antibiotics.      ED Prescriptions   None    PDMP not reviewed this encounter.   Lyndee Hensen, DO 10/27/22 2040

## 2023-02-21 ENCOUNTER — Ambulatory Visit
Admission: EM | Admit: 2023-02-21 | Discharge: 2023-02-21 | Disposition: A | Discharge status: Home / Self Care | Payer: 59 | Attending: Family Medicine | Admitting: Family Medicine

## 2023-02-21 DIAGNOSIS — N3001 Acute cystitis with hematuria: Secondary | ICD-10-CM

## 2023-02-21 LAB — URINALYSIS, W/ REFLEX TO CULTURE (INFECTION SUSPECTED)
Bilirubin Urine: NEGATIVE
Glucose, UA: NEGATIVE mg/dL
Ketones, ur: NEGATIVE mg/dL
Nitrite: NEGATIVE
Protein, ur: NEGATIVE mg/dL
Specific Gravity, Urine: 1.02 (ref 1.005–1.030)
WBC, UA: >50 WBC/hpf (ref 0–5)
pH: 7 (ref 5.0–8.0)

## 2023-02-21 MED ORDER — CEPHALEXIN 500 MG PO CAPS: 500.0000 mg | ORAL_CAPSULE | Freq: Four times a day (QID) | ORAL | 0 refills | Status: DC

## 2023-02-21 NOTE — ED Triage Notes (Signed)
Pt c/o urinary freq,burning,pain & R flank pain since this AM. Denies any hematuria.

## 2023-02-21 NOTE — ED Provider Notes (Signed)
[facility]    CSN: [id number] Arrival date & time: 02/21/23  1608      History   Chief Complaint Chief Complaint  Patient presents with   Dysuria   Flank Pain          HPI HPI Jade Chen is a 27 y.o. female.    Jade Chen presents for buringing with urination that started this morning. Has urinary frequency but only small amounts of urine come out. She had similar sx when she had a UTI earlier this year.      Past Medical History:  Diagnosis Date   Migraine     There are no problems to display for this patient.   Past Surgical History:  Procedure Laterality Date   NO PAST SURGERIES      OB History   No obstetric history on file.      Home Medications    Prior to Admission medications   Medication Sig Start Date End Date Taking? Authorizing Provider  norethindrone (MICRONOR) 0.35 MG tablet Take 1 tablet by mouth daily.   Yes [provider]  SUMAtriptan (IMITREX) 50 MG tablet Take by mouth.   Yes [provider]  cephALEXin (KEFLEX) 500 MG capsule Take 1 capsule (500 mg total) by mouth 4 (four) times daily. 02/21/23   Katha Cabal, DO    Family History History reviewed. No pertinent family history.  Social History Social History   Tobacco Use   Smoking status: Never   Smokeless tobacco: Never  Vaping Use   Vaping Use: Some days  Substance Use Topics   Alcohol use: Yes    Comment: occasionally   Drug use: No     Allergies   Patient has no known allergies.   Review of Systems Review of Systems: :negative unless otherwise stated in HPI.      Physical Exam Triage Vital Signs ED Triage Vitals  Enc Vitals Group     BP 02/21/23 1708 106/73     Pulse Rate 02/21/23 1708 (!) 115     Resp 02/21/23 1708 16     Temp 02/21/23 1708 98.5 F (36.9 C)     Temp Source 02/21/23 1708 Oral     SpO2 02/21/23 1708 100 %     Weight 02/21/23 1708 120 lb (54.4 kg)     Height 02/21/23 1708 5\' 7"  (1.702 m)      Head Circumference --      Peak Flow --      Pain Score 02/21/23 1711 0     Pain Loc --      Pain Edu? --      Excl. in [place]? --    No data found.  Updated Vital Signs BP 106/73 (BP Location: Left Arm)   Pulse (!) 115   Temp 98.5 F (36.9 C) (Oral)   Resp 16   Ht 5\' 7"  (1.702 m)   Wt 54.4 kg   SpO2 100%   BMI 18.79 kg/m   Visual Acuity Right Eye Distance:   Left Eye Distance:   Bilateral Distance:    Right Eye Near:   Left Eye Near:    Bilateral Near:     Physical Exam GEN: well appearing female in no acute distress  CVS: well perfused  RESP: speaking in full sentences without pause  ABD: soft, non-tender, non-distended, no palpable masses     UC Treatments / Results  Labs (all labs ordered are listed, but only abnormal results are  displayed) Labs Reviewed  URINALYSIS, W/ REFLEX TO CULTURE (INFECTION SUSPECTED) - Abnormal; Notable for the following components:      Result Value   APPearance HAZY (*)    Hgb urine dipstick MODERATE (*)    Leukocytes,Ua MODERATE (*)    Bacteria, UA MANY (*)    All other components within normal limits  URINE CULTURE    EKG   Radiology No results found.  Procedures Procedures (including critical care time)  Medications Ordered in UC Medications - No data to display  Initial Impression / Assessment and Plan / UC Course  I have reviewed the triage vital signs and the nursing notes.  Pertinent labs & imaging results that were available during my care of the patient were reviewed by me and considered in my medical decision making (see chart for details).     Acute cystitis:  Patient is a 27 y.o. female  who presents for 1 day of dysuria and urinary frequency.  Overall patient is well-appearing and afebrile.  Vital signs stable.  UA consistent with acute cystitis.  Hematuria supported on microscopy.  Treat with Keflex 4 times daily for 5 days.  Urine culture obtained.  Follow-up sensitivities and change antibiotics,  if needed.   - Return precautions including abdominal pain, fever, chills, nausea, or vomiting given. Follow-up,  if symptoms not improving or getting worse. Discussed MDM, treatment plan and plan for follow-up with patient/parent who agrees with plan.        Final Clinical Impressions(s) / UC Diagnoses   Final diagnoses:  Acute cystitis with hematuria     Discharge Instructions      You had evidence of of a bacterial infection in your urine today.  Stop by the pharmacy to pick up your prescriptions.  For your UTI: Take Keflex 4 times a day for the next 5 days  If your symptoms do not improve in the next 7 days, be sure to follow-up here or at your primary care provider office.  Go to the emergency department if you are having increasing pain, worsening vaginal bleeding or fever.      ED Prescriptions     Medication Sig Dispense Auth. Provider   cephALEXin (KEFLEX) 500 MG capsule Take 1 capsule (500 mg total) by mouth 4 (four) times daily. 20 capsule Katha Cabal, DO      PDMP not reviewed this encounter.   Katha Cabal, DO 02/21/23 1751

## 2023-02-21 NOTE — Discharge Instructions (Signed)
You had evidence of of a bacterial infection in your urine today.  Stop by the pharmacy to pick up your prescriptions.  For your UTI: Take Keflex 4 times a day for the next 5 days  If your symptoms do not improve in the next 7 days, be sure to follow-up here or at your primary care provider office.  Go to the emergency department if you are having increasing pain, worsening vaginal bleeding or fever.  

## 2023-02-24 LAB — URINE CULTURE: Culture: >=100000 — AB

## 2023-03-03 ENCOUNTER — Ambulatory Visit: Payer: 59

## 2023-03-04 ENCOUNTER — Ambulatory Visit: Payer: 59

## 2023-03-25 DIAGNOSIS — N7093 Salpingitis and oophoritis, unspecified: Secondary | ICD-10-CM | POA: Insufficient documentation

## 2023-05-31 ENCOUNTER — Ambulatory Visit
Admission: RE | Admit: 2023-05-31 | Discharge: 2023-05-31 | Disposition: A | Discharge status: Home / Self Care | Payer: Managed Care, Other (non HMO) | Source: Ambulatory Visit | Attending: Physician Assistant | Admitting: Physician Assistant

## 2023-05-31 VITALS — BP 114/72 | HR 66 | Temp 98.7°F | Resp 18

## 2023-05-31 DIAGNOSIS — J40 Bronchitis, not specified as acute or chronic: Secondary | ICD-10-CM | POA: Diagnosis not present

## 2023-05-31 DIAGNOSIS — J329 Chronic sinusitis, unspecified: Secondary | ICD-10-CM

## 2023-05-31 DIAGNOSIS — R051 Acute cough: Secondary | ICD-10-CM

## 2023-05-31 MED ORDER — PROMETHAZINE-DM 6.25-15 MG/5ML PO SYRP: 5.0000 mL | ORAL_SOLUTION | Freq: Every evening | ORAL | 0 refills | Status: DC | PRN

## 2023-05-31 MED ORDER — AMOXICILLIN-POT CLAVULANATE 875-125 MG PO TABS
1.0000 | ORAL_TABLET | Freq: Two times a day (BID) | ORAL | 0 refills | Status: DC
Start: 2023-05-31 — End: 2023-06-21

## 2023-05-31 NOTE — ED Triage Notes (Signed)
Pt states 05/23/23 she woke up with a cough, fatigue, headache and fever. Her initial symptoms went after after 1 day, but she developed a cough that has not gone away.   Pt's cough is productive and she had chest congestion. She has tried OTC cough medication with no relief.

## 2023-05-31 NOTE — Discharge Instructions (Signed)
Start Augmentin twice daily for 7 days.  Take this with food as it can upset your stomach.  It can decrease the effectiveness of birth control to use backup birth control while on this medication for 1 week after.  Take Promethazine DM at night.  This will make you sleepy so do not drive drink alcohol with taking it.  You can use over-the-counter medication including Mucinex, Flonase, Tylenol.  If your symptoms are not improving within a week please return for reevaluation.  If anything worsens please be seen immediately.

## 2023-05-31 NOTE — ED Provider Notes (Addendum)
[facility]    CSN: [id number] Arrival date & time: 05/31/23  1821      History   Chief Complaint Chief Complaint  Patient presents with   URI    Had a 100' fever and fatigue last Monday, and after that developed a croup cough that will not go away. Also experiencing sore throat at night and sometimes losing my voice during the day. - Entered by patient   Cough    HPI Jade Chen is a 27 y.o. female.   Patient presents today with an 8-day history of URI symptoms.  Reports that when her symptoms began on 05/23/2023 she had URI symptoms including a mild fever.  They lasted for approximately 1 day then she felt much better.  A few days later she developed a cough that has progressively worsened.  She reports that this is productive and keeping her up at night.  She denies any additional symptoms at this time including fever, congestion, nausea, vomiting.  She has tried over-the-counter medication including Mucinex without improvement of symptoms.  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  Denies any recent steroids.  Does report that she was treated for a pelvic infection the end of June early July but denies additional antibiotics since that time.  She denies any history of asthma, allergies, smoking.  She has no concern for pregnancy.    Past Medical History:  Diagnosis Date   Migraine     There are no problems to display for this patient.   Past Surgical History:  Procedure Laterality Date   NO PAST SURGERIES      OB History   No obstetric history on file.      Home Medications    Prior to Admission medications   Medication Sig Start Date End Date Taking? Authorizing Provider  amoxicillin-clavulanate (AUGMENTIN) 875-125 MG tablet Take 1 tablet by mouth every 12 (twelve) hours. 05/31/23  Yes [place] Goh, Noberto Retort, PA-C  norethindrone (MICRONOR) 0.35 MG tablet Take 1 tablet by mouth daily.   Yes [provider]  promethazine-dextromethorphan (PROMETHAZINE-DM) 6.25-15 MG/5ML syrup Take 5 mLs by mouth at bedtime as needed for cough. 05/31/23  Yes Brenner Visconti K, PA-C  SUMAtriptan (IMITREX) 50 MG tablet Take by mouth.   Yes [provider]    Family History No family history on file.  Social History Social History   Tobacco Use   Smoking status: Never   Smokeless tobacco: Never  Vaping Use   Vaping status: Former  Substance Use Topics   Alcohol use: Yes    Comment: occasionally   Drug use: No     Allergies   Patient has no known allergies.   Review of Systems Review of Systems  Constitutional:  Positive for activity change. Negative for appetite change, fatigue and fever (Resolved).  HENT:  Positive for congestion. Negative for sinus pressure, sneezing and sore throat.   Respiratory:  Positive for cough. Negative for shortness of breath.   Cardiovascular:  Negative for chest pain.  Gastrointestinal:  Negative for abdominal pain, diarrhea, nausea and vomiting.     Physical Exam Triage Vital Signs ED Triage Vitals  Encounter Vitals Group     BP 05/31/23 1859 114/72     Systolic BP Percentile --      Diastolic BP Percentile --      Pulse Rate 05/31/23 1859 66     Resp 05/31/23 1859 18  Temp 05/31/23 1859 98.7 F (37.1 C)     Temp Source 05/31/23 1859 Oral     SpO2 05/31/23 1859 100 %     Weight --      Height --      Head Circumference --      Peak Flow --      Pain Score 05/31/23 1858 0     Pain Loc --      Pain Education --      Exclude from Growth Chart --    No data found.  Updated Vital Signs BP 114/72 (BP Location: Left Arm)   Pulse 66   Temp 98.7 F (37.1 C) (Oral)   Resp 18   LMP 05/27/2023 (Approximate)   SpO2 100%   Visual Acuity Right Eye Distance:   Left Eye Distance:   Bilateral Distance:    Right Eye Near:   Left Eye Near:    Bilateral Near:     Physical Exam Vitals reviewed.  Constitutional:      General: She  is awake. She is not in acute distress.    Appearance: Normal appearance. She is well-developed. She is not ill-appearing.     Comments: Very pleasant female appears stated age in no acute distress sitting comfortably in exam room  HENT:     Head: Normocephalic and atraumatic.     Right Ear: Tympanic membrane, ear canal and external ear normal. Tympanic membrane is not erythematous or bulging.     Left Ear: Tympanic membrane, ear canal and external ear normal. Tympanic membrane is not erythematous or bulging.     Nose:     Right Sinus: No maxillary sinus tenderness or frontal sinus tenderness.     Left Sinus: No maxillary sinus tenderness or frontal sinus tenderness.     Mouth/Throat:     Pharynx: Uvula midline. No oropharyngeal exudate or posterior oropharyngeal erythema.  Cardiovascular:     Rate and Rhythm: Normal rate and regular rhythm.     Heart sounds: Normal heart sounds, S1 normal and S2 normal. No murmur heard. Pulmonary:     Effort: Pulmonary effort is normal.     Breath sounds: Normal breath sounds. No wheezing, rhonchi or rales.     Comments: Clear to auscultation bilaterally Psychiatric:        Behavior: Behavior is cooperative.      UC Treatments / Results  Labs (all labs ordered are listed, but only abnormal results are displayed) Labs Reviewed - No data to display  EKG   Radiology No results found.  Procedures Procedures (including critical care time)  Medications Ordered in UC Medications - No data to display  Initial Impression / Assessment and Plan / UC Course  I have reviewed the triage vital signs and the nursing notes.  Pertinent labs & imaging results that were available during my care of the patient were reviewed by me and considered in my medical decision making (see chart for details).     Patient is well-appearing, afebrile, nontoxic, nontachycardic.  No indication for viral testing as she has been symptomatic for over a week and this would  not change management.  Given her prolonged and worsening symptoms will cover for secondary bacterial infection with Augmentin twice daily for 7 days.  We did discuss that this can decrease the effectiveness of her birth control so she should use backup birth control while on this medication.  No indication for chest x-ray as she had no adventitious lung sounds on exam  and oxygen saturation of 100%.  She was given Promethazine DM to be used for nocturnal cough symptoms with instruction not to drive or drink alcohol taking this.  She can use over-the-counter medication for additional symptom relief.  Recommend that she rest and drink plenty of fluid.  If her symptoms are not improving within a week she is to return for reevaluation.  If she has any worsening symptoms including high fever, chest pain, shortness of breath, worsening cough she needs to be seen immediately.  Strict return precautions given.  She declined work excuse note.  Final Clinical Impressions(s) / UC Diagnoses   Final diagnoses:  Sinobronchitis  Acute cough     Discharge Instructions      Start Augmentin twice daily for 7 days.  Take this with food as it can upset your stomach.  It can decrease the effectiveness of birth control to use backup birth control while on this medication for 1 week after.  Take Promethazine DM at night.  This will make you sleepy so do not drive drink alcohol with taking it.  You can use over-the-counter medication including Mucinex, Flonase, Tylenol.  If your symptoms are not improving within a week please return for reevaluation.  If anything worsens please be seen immediately.    ED Prescriptions     Medication Sig Dispense Auth. Provider   amoxicillin-clavulanate (AUGMENTIN) 875-125 MG tablet Take 1 tablet by mouth every 12 (twelve) hours. 14 tablet Jihan Mellette K, PA-C   promethazine-dextromethorphan (PROMETHAZINE-DM) 6.25-15 MG/5ML syrup Take 5 mLs by mouth at bedtime as needed for cough. 118  mL Haaris Metallo K, PA-C      PDMP not reviewed this encounter.   Jeani Hawking, PA-C 05/31/23 2013    RaspetNoberto Retort, PA-C 05/31/23 2014

## 2023-06-04 DIAGNOSIS — N83519 Torsion of ovary and ovarian pedicle, unspecified side: Secondary | ICD-10-CM | POA: Insufficient documentation

## 2023-06-21 ENCOUNTER — Ambulatory Visit
Admission: EM | Admit: 2023-06-21 | Discharge: 2023-06-21 | Disposition: A | Discharge status: Home / Self Care | Payer: Managed Care, Other (non HMO) | Attending: Physician Assistant | Admitting: Physician Assistant

## 2023-06-21 DIAGNOSIS — R102 Pelvic and perineal pain: Secondary | ICD-10-CM | POA: Insufficient documentation

## 2023-06-21 DIAGNOSIS — R21 Rash and other nonspecific skin eruption: Secondary | ICD-10-CM | POA: Diagnosis not present

## 2023-06-21 MED ORDER — PREDNISONE 20 MG PO TABS: ORAL_TABLET | ORAL | 0 refills | Status: DC

## 2023-06-21 MED ORDER — TRIAMCINOLONE ACETONIDE 0.1 % EX CREA: 1.0000 | TOPICAL_CREAM | Freq: Two times a day (BID) | CUTANEOUS | 0 refills | Status: DC

## 2023-06-21 MED ORDER — HYDROXYZINE HCL 25 MG PO TABS: 25.0000 mg | ORAL_TABLET | Freq: Four times a day (QID) | ORAL | 1 refills | Status: DC

## 2023-06-21 NOTE — ED Triage Notes (Addendum)
Pt c/o insect bite. States I thought I was bit by mosquitoes on my ankles and soles of my feet. However I now have a rash along my sock line on both feet and the itching on the soles of my feet and ankles is almost unbearable. It's keeping me up at night.-Entered by patient

## 2023-06-21 NOTE — ED Provider Notes (Signed)
[facility]    CSN: [id number] Arrival date & time: 06/21/23  1754      History   Chief Complaint Chief Complaint  Patient presents with   Insect Bite    HPI Jade Chen is a 27 y.o. female presenting for insect bites and rash on ankles, legs and feet.  She says some of the areas of rash look like mosquito bites on her feet but she has noticed a ringlike rash and band formation of both ankles.  She denies any changes to her detergents, body washes, lotions and no new medications.  Denies any tick bites.  Has tried hydrocortisone cream and Benadryl cream, but does not think it has helped.  HPI  Past Medical History:  Diagnosis Date   Migraine     Patient Active Problem List   Diagnosis Date Noted   Pelvic pain in female 06/21/2023   Ovarian torsion 06/04/2023   TOA (tubo-ovarian abscess) 03/25/2023   Migraine without aura and without status migrainosus, not intractable 04/14/2021    Past Surgical History:  Procedure Laterality Date   NO PAST SURGERIES      OB History   No obstetric history on file.      Home Medications    Prior to Admission medications   Medication Sig Start Date End Date Taking? Authorizing Provider  hydrOXYzine (ATARAX) 25 MG tablet Take 1 tablet (25 mg total) by mouth every 6 (six) hours. 06/21/23  Yes Shirlee Latch, PA-C  norethindrone (MICRONOR) 0.35 MG tablet Take 1 tablet by mouth daily.   Yes [provider]  predniSONE (DELTASONE) 20 MG tablet Take 6 tabs po on day 1 and decrease by 1 tab daily until complete 06/21/23  Yes Eusebio Friendly B, PA-C  SUMAtriptan (IMITREX) 50 MG tablet Take by mouth.   Yes [provider]  triamcinolone cream (KENALOG) 0.1 % Apply 1 Application topically 2 (two) times daily. 06/21/23  Yes Shirlee Latch PA-C    Family History History reviewed. No pertinent family history.  Social History Social History   Tobacco Use   Smoking status: Never   Smokeless tobacco: Never   Vaping Use   Vaping status: Former  Substance Use Topics   Alcohol use: Yes    Comment: occasionally   Drug use: No     Allergies   Patient has no known allergies.   Review of Systems Review of Systems  Constitutional:  Negative for fatigue and fever.  Musculoskeletal:  Negative for arthralgias and joint swelling.  Skin:  Positive for rash.     Physical Exam Triage Vital Signs ED Triage Vitals  Encounter Vitals Group     BP 06/21/23 1907 (!) 143/83     Systolic BP Percentile --      Diastolic BP Percentile --      Pulse Rate 06/21/23 1907 87     Resp 06/21/23 1907 16     Temp 06/21/23 1907 98.5 F (36.9 C)     Temp Source 06/21/23 1907 Oral     SpO2 06/21/23 1907 98 %     Weight 06/21/23 1906 125 lb (56.7 kg)     Height 06/21/23 1906 5\' 7"  (1.702 m)     Head Circumference --      Peak Flow --      Pain Score 06/21/23 1910 0     Pain Loc --      Pain Education --      Exclude from Growth Chart --  No data found.  Updated Vital Signs BP (!) 143/83 (BP Location: Right Arm)   Pulse 87   Temp 98.5 F (36.9 C) (Oral)   Resp 16   Ht 5\' 7"  (1.702 m)   Wt 125 lb (56.7 kg)   LMP 05/27/2023 (Approximate)   SpO2 98%   BMI 19.58 kg/m   Physical Exam Vitals and nursing note reviewed.  Constitutional:      General: She is not in acute distress.    Appearance: Normal appearance. She is not ill-appearing or toxic-appearing.  HENT:     Head: Normocephalic and atraumatic.  Eyes:     General: No scleral icterus.       Right eye: No discharge.        Left eye: No discharge.     Conjunctiva/sclera: Conjunctivae normal.  Cardiovascular:     Rate and Rhythm: Normal rate and regular rhythm.     Heart sounds: Normal heart sounds.  Pulmonary:     Effort: Pulmonary effort is normal. No respiratory distress.     Breath sounds: Normal breath sounds.  Musculoskeletal:     Cervical back: Neck supple.  Skin:    General: Skin is dry.     Findings: Rash (Multiple  erythematous papules of bilateral feet. Erythematous maculopapular rash along both sock lines) present.  Neurological:     General: No focal deficit present.     Mental Status: She is alert. Mental status is at baseline.     Motor: No weakness.     Gait: Gait normal.  Psychiatric:        Mood and Affect: Mood normal.        Behavior: Behavior normal.        Thought Content: Thought content normal.      UC Treatments / Results  Labs (all labs ordered are listed, but only abnormal results are displayed) Labs Reviewed - No data to display  EKG   Radiology No results found.  Procedures Procedures (including critical care time)  Medications Ordered in UC Medications - No data to display  Initial Impression / Assessment and Plan / UC Course  I have reviewed the triage vital signs and the nursing notes.  Pertinent labs & imaging results that were available during my care of the patient were reviewed by me and considered in my medical decision making (see chart for details).   27 year old female presents for approximately a week history of rash and insect bites of feet and ankles.  Has tried topical cortisone cream and Benadryl without relief.  On she has multiple erythematous papules consistent with insect bites on her feet but a different erythematous maculopapular rash in a ringlike formation around her sock line.  Suspect mosquito bites but also contact dermatitis.  Sent triamcinolone cream and prednisone as well as hydroxyzine.  Reviewed cool compresses.  Reviewed return precautions.   Final Clinical Impressions(s) / UC Diagnoses   Final diagnoses:  Rash   Discharge Instructions   None    ED Prescriptions     Medication Sig Dispense Auth. Provider   predniSONE (DELTASONE) 20 MG tablet Take 6 tabs po on day 1 and decrease by 1 tab daily until complete 21 tablet Eusebio Friendly B, PA-C   triamcinolone cream (KENALOG) 0.1 % Apply 1 Application topically 2 (two) times  daily. 45 g Eusebio Friendly B, PA-C   hydrOXYzine (ATARAX) 25 MG tablet Take 1 tablet (25 mg total) by mouth every 6 (six) hours. 30 tablet Eusebio Friendly  B, PA-C      PDMP not reviewed this encounter.   Shirlee Latch, PA-C 06/24/23 [phone]

## 2023-06-22 ENCOUNTER — Ambulatory Visit: Payer: Managed Care, Other (non HMO)

## 2023-06-28 ENCOUNTER — Ambulatory Visit
Admission: EM | Admit: 2023-06-28 | Discharge: 2023-06-28 | Disposition: A | Discharge status: Home / Self Care | Payer: Managed Care, Other (non HMO) | Attending: Emergency Medicine | Admitting: Emergency Medicine

## 2023-06-28 ENCOUNTER — Encounter: Payer: Self-pay | Admitting: Emergency Medicine

## 2023-06-28 DIAGNOSIS — N390 Urinary tract infection, site not specified: Secondary | ICD-10-CM | POA: Diagnosis not present

## 2023-06-28 LAB — URINALYSIS, W/ REFLEX TO CULTURE (INFECTION SUSPECTED)
Bilirubin Urine: NEGATIVE
Glucose, UA: NEGATIVE mg/dL
Ketones, ur: NEGATIVE mg/dL
Nitrite: NEGATIVE
Protein, ur: NEGATIVE mg/dL
Specific Gravity, Urine: 1.015 (ref 1.005–1.030)
WBC, UA: >50 WBC/hpf (ref 0–5)
pH: 7 (ref 5.0–8.0)

## 2023-06-28 MED ORDER — PHENAZOPYRIDINE HCL 200 MG PO TABS: 200.0000 mg | ORAL_TABLET | Freq: Three times a day (TID) | ORAL | 0 refills | Status: DC

## 2023-06-28 MED ORDER — NITROFURANTOIN MONOHYD MACRO 100 MG PO CAPS: 100.0000 mg | ORAL_CAPSULE | Freq: Two times a day (BID) | ORAL | 0 refills | Status: DC

## 2023-06-28 NOTE — Discharge Instructions (Addendum)

## 2023-06-28 NOTE — ED Provider Notes (Signed)
[facility]    CSN: [id number] Arrival date & time: 06/28/23  1722      History   Chief Complaint Chief Complaint  Patient presents with   Urinary Frequency   Dysuria    HPI Jade Chen is a 27 y.o. female.   HPI  27 year old female with a past medical history of a significant for ovarian torsion, tubo-ovarian abscess, migraine with aura, and pelvic pain in a female presents for evaluation of urinary frequency and burning with urination that started today.  She also has a sensation of incomplete emptying when she does void.  She denies any urgency or hematuria.  Also no fever, nausea, vomiting, or back pain.  She had not had a UTI until January of this year and this would make her third occurrence.  Past Medical History:  Diagnosis Date   Migraine     Patient Active Problem List   Diagnosis Date Noted   Pelvic pain in female 06/21/2023   Ovarian torsion 06/04/2023   TOA (tubo-ovarian abscess) 03/25/2023   Migraine without aura and without status migrainosus, not intractable 04/14/2021    Past Surgical History:  Procedure Laterality Date   NO PAST SURGERIES      OB History   No obstetric history on file.      Home Medications    Prior to Admission medications   Medication Sig Start Date End Date Taking? Authorizing Provider  nitrofurantoin, macrocrystal-monohydrate, (MACROBID) 100 MG capsule Take 1 capsule (100 mg total) by mouth 2 (two) times daily. 06/28/23  Yes Becky Augusta, NP  phenazopyridine (PYRIDIUM) 200 MG tablet Take 1 tablet (200 mg total) by mouth 3 (three) times daily. 06/28/23  Yes Becky Augusta, NP  hydrOXYzine (ATARAX) 25 MG tablet Take 1 tablet (25 mg total) by mouth every 6 (six) hours. 06/21/23   Shirlee Latch, PA-C  norethindrone (MICRONOR) 0.35 MG tablet Take 1 tablet by mouth daily.    [provider]  predniSONE (DELTASONE) 20 MG tablet Take 6 tabs po on day 1 and decrease by 1 tab daily until complete 06/21/23    Eusebio Friendly B, PA-C  SUMAtriptan (IMITREX) 50 MG tablet Take by mouth.    [provider]  triamcinolone cream (KENALOG) 0.1 % Apply 1 Application topically 2 (two) times daily. 06/21/23   Shirlee Latch, PA-C    Family History History reviewed. No pertinent family history.  Social History Social History   Tobacco Use   Smoking status: Never   Smokeless tobacco: Never  Vaping Use   Vaping status: Former  Substance Use Topics   Alcohol use: Yes    Comment: occasionally   Drug use: No     Allergies   Patient has no known allergies.   Review of Systems Review of Systems  Constitutional:  Negative for fever.  Gastrointestinal:  Negative for abdominal pain, nausea and vomiting.  Genitourinary:  Positive for dysuria and frequency. Negative for hematuria, urgency, vaginal discharge and vaginal pain.  Musculoskeletal:  Negative for back pain.     Physical Exam Triage Vital Signs ED Triage Vitals  Encounter Vitals Group     BP 06/28/23 1726 110/74     Systolic BP Percentile --      Diastolic BP Percentile --      Pulse Rate 06/28/23 1726 61     Resp 06/28/23 1726 16     Temp 06/28/23 1726 97.8 F (36.6 C)     Temp Source 06/28/23 1726 Oral  SpO2 06/28/23 1726 100 %     Weight --      Height --      Head Circumference --      Peak Flow --      Pain Score 06/28/23 1725 0     Pain Loc --      Pain Education --      Exclude from Growth Chart --    No data found.  Updated Vital Signs BP 110/74 (BP Location: Left Arm)   Pulse 61   Temp 97.8 F (36.6 C) (Oral)   Resp 16   LMP 05/27/2023 (Approximate)   SpO2 100%   Visual Acuity Right Eye Distance:   Left Eye Distance:   Bilateral Distance:    Right Eye Near:   Left Eye Near:    Bilateral Near:     Physical Exam Vitals and nursing note reviewed.  Constitutional:      Appearance: Normal appearance. She is not ill-appearing.  HENT:     Head: Normocephalic and atraumatic.  Cardiovascular:      Rate and Rhythm: Normal rate.     Pulses: Normal pulses.     Heart sounds: Normal heart sounds. No murmur heard.    No friction rub. No gallop.  Pulmonary:     Effort: Pulmonary effort is normal.     Breath sounds: Normal breath sounds. No wheezing, rhonchi or rales.  Abdominal:     Tenderness: There is no right CVA tenderness or left CVA tenderness.  Skin:    General: Skin is warm and dry.     Capillary Refill: Capillary refill takes less than 2 seconds.  Neurological:     General: No focal deficit present.     Mental Status: She is alert and oriented to person, place, and time.      UC Treatments / Results  Labs (all labs ordered are listed, but only abnormal results are displayed) Labs Reviewed  URINALYSIS, W/ REFLEX TO CULTURE (INFECTION SUSPECTED) - Abnormal; Notable for the following components:      Result Value   Color, Urine STRAW (*)    APPearance HAZY (*)    Hgb urine dipstick SMALL (*)    Leukocytes,Ua SMALL (*)    Bacteria, UA FEW (*)    All other components within normal limits  URINE CULTURE    EKG   Radiology No results found.  Procedures Procedures (including critical care time)  Medications Ordered in UC Medications - No data to display  Initial Impression / Assessment and Plan / UC Course  I have reviewed the triage vital signs and the nursing notes.  Pertinent labs & imaging results that were available during my care of the patient were reviewed by me and considered in my medical decision making (see chart for details).   Patient is a pleasant, nontoxic-appearing 27 year old female presenting for evaluation of UTI symptoms which began this morning as outlined HPI above.  The patient reports that she has not had a history of UTIs until January of this year.  In January she did get married and she reports that she is more sexually active than prior to marriage but never had a UTI related to sexual intercourse in the past.  She denies any hot  tub exposure, taking baths, using bath bombs, changes in soap, or hanging out wet clothing following swimming or exercise.  She has no other risk factors for urinary tract infection.  Her physical exam is unremarkable and she has no CVA  tenderness.  I will order urinalysis to evaluate for the presence of UTI.  Urinalysis shows sterile color with hazy appearance.  Specific gravity is normal at 1.015.  Small and a hemoglobin is present along with a small Mehta leukocyte esterase but negative for nitrites or protein.  Reflex microscopy shows greater than 50 WBCs with few bacteria and WBC clumps.  Urine will reflex to culture.  I will discharge patient on the diagnosis of UTI and start her on Macrobid 100 mg twice daily for 5 days along with Pyridium 200 mg every 8 hours as needed for urinary discomfort.  Return precautions reviewed.   Final Clinical Impressions(s) / UC Diagnoses   Final diagnoses:  Lower urinary tract infectious disease     Discharge Instructions      Take the Macrobid twice daily for 5 days with food for treatment of urinary tract infection.  Use the Pyridium every 8 hours as needed for urinary discomfort.  This will turn your urine a bright red-orange.  Increase your oral fluid intake so that you increase your urine production and or flushing your urinary system.  Take an over-the-counter probiotic, such as Culturelle-Align-Activia, 1 hour after each dose of antibiotic to prevent diarrhea or yeast infections from forming.  We will culture urine and change the antibiotics if necessary.  Return for reevaluation, or see your primary care provider, for any new or worsening symptoms.      ED Prescriptions     Medication Sig Dispense Auth. Provider   nitrofurantoin, macrocrystal-monohydrate, (MACROBID) 100 MG capsule Take 1 capsule (100 mg total) by mouth 2 (two) times daily. 10 capsule Becky Augusta, NP   phenazopyridine (PYRIDIUM) 200 MG tablet Take 1 tablet (200 mg  total) by mouth 3 (three) times daily. 6 tablet Becky Augusta, NP      I have reviewed the PDMP during this encounter.   Becky Augusta, NP 06/28/23 1756

## 2023-06-28 NOTE — ED Triage Notes (Signed)
Pt presents with dysuria and urinary frequency that started today.

## 2023-07-01 LAB — URINE CULTURE: Culture: >=100000 — AB

## 2023-07-24 ENCOUNTER — Ambulatory Visit
Admission: RE | Admit: 2023-07-24 | Discharge: 2023-07-24 | Disposition: A | Discharge status: Home / Self Care | Payer: Managed Care, Other (non HMO) | Source: Ambulatory Visit

## 2023-07-24 VITALS — BP 120/79 | HR 80 | Temp 98.5°F | Ht 67.0 in | Wt 130.0 lb

## 2023-07-24 DIAGNOSIS — T7840XA Allergy, unspecified, initial encounter: Secondary | ICD-10-CM | POA: Diagnosis not present

## 2023-07-24 DIAGNOSIS — R197 Diarrhea, unspecified: Secondary | ICD-10-CM

## 2023-07-24 NOTE — ED Triage Notes (Signed)
PT c/o possible sinus infection x2weeks  Pt states that she has been having continuous yellow and brown nasal drainage and congestion  Pt denies cough   Pt states that she has been having mucus in her stool and is unsure if it is coming from her swallowing her mucus x1week.  Pt states that the stool is not solid, mostly mucus.   Pt denies abdominal pain.

## 2023-07-24 NOTE — Discharge Instructions (Signed)
You were seen for diarrhea and nasal congestion and are being treated for allergies and diarrhea.   -Start taking antihistamine such as Zyrtec or Xyzal daily. -Also take Benadryl at nighttime if your symptoms are worsening at night and interfering with your sleep. -Use an intranasal steroid such as Flonase, 1 spray in each nostril twice a day. -A humidifier at nighttime may also be helpful. -Start an over-the-counter probiotic daily.  Some brand options are HUM nutrition and Love Wellness from [employer].  Take care, Dr. Sharlet Salina, NP-c

## 2023-07-24 NOTE — ED Provider Notes (Signed)
[facility] [place]   Name: Jade Chen DOB: 01-11-1996 MRN: [id number] CSN: [id number] PCP: Patient, No Pcp Per  Arrival date and time:  07/24/23 1115  Chief Complaint:  Diarrhea   NOTE: Prior to seeing the patient today, I have reviewed the triage nursing documentation and vital signs. Clinical staff has updated patient's PMH/PSHx, current medication list, and drug allergies/intolerances to ensure comprehensive history available to assist in medical decision making.   History:   HPI: Jade Chen is a 27 y.o. female who presents today alone with complaints of increased nasal congestion and diarrhea. Patient states her nasal congestion started approximately 3 weeks ago and she attributed the symptoms to a cold.  She treated her symptoms with DayQuil and noticed significant decrease in her remaining symptoms.  However she has continued to notice drainage from her nose and blowing of her nose.  She denies any fevers chills or bodyaches.  She works as a Manufacturing systems engineer. Patient has also noticed the change in her bowel movements over the past week.  She has noticed mucus in her stool over the past week and increased gas.  No abdominal pain.  She denies any changes to her dietary habits.  No nausea vomiting.  She has had some bowel inconsistencies over the past year due to constipation and frequent antibiotics.   Past Medical History:  Diagnosis Date   Migraine     Past Surgical History:  Procedure Laterality Date   NO PAST SURGERIES      History reviewed. No pertinent family history.  Social History   Tobacco Use   Smoking status: Never   Smokeless tobacco: Never  Vaping Use   Vaping status: Former  Substance Use Topics   Alcohol use: Yes    Comment: occasionally   Drug use: No    Patient Active Problem List   Diagnosis Date Noted   Pelvic pain in female 06/21/2023   Ovarian torsion 06/04/2023   TOA (tubo-ovarian abscess) 03/25/2023    Migraine without aura and without status migrainosus, not intractable 04/14/2021    Home Medications:    Current Meds  Medication Sig   norethindrone (AYGESTIN) 5 MG tablet Take by mouth.    Allergies:   Patient has no known allergies.  Review of Systems (ROS): Review of Systems  Constitutional:  Negative for activity change, appetite change, diaphoresis, fatigue and fever.  HENT:  Positive for congestion and postnasal drip. Negative for ear discharge, ear pain, facial swelling, hearing loss, sinus pressure, sinus pain, sneezing and sore throat.   Eyes:  Negative for pain.  Respiratory:  Positive for cough. Negative for shortness of breath and wheezing.   Gastrointestinal:  Positive for diarrhea. Negative for constipation, nausea and vomiting.  Musculoskeletal:  Positive for myalgias.  All other systems reviewed and are negative.    Vital Signs: Today's Vitals   07/24/23 1132 07/24/23 1133 07/24/23 1135  BP:   120/79  Pulse:   80  Temp:   98.5 F (36.9 C)  TempSrc:   Oral  SpO2:   100%  Weight: 130 lb (59 kg)    Height: 5\' 7"  (1.702 m)    PainSc:  0-No pain     Physical Exam: Physical Exam Vitals and nursing note reviewed.  Constitutional:      Appearance: Normal appearance.  HENT:     Right Ear: Tympanic membrane normal.     Left Ear: Tympanic membrane normal.     Nose:  Right Sinus: No maxillary sinus tenderness or frontal sinus tenderness.     Left Sinus: No maxillary sinus tenderness or frontal sinus tenderness.     Mouth/Throat:     Pharynx: Posterior oropharyngeal erythema and postnasal drip present. No pharyngeal swelling or oropharyngeal exudate.     Tonsils: No tonsillar exudate.  Cardiovascular:     Rate and Rhythm: Normal rate and regular rhythm.     Pulses: Normal pulses.     Heart sounds: Normal heart sounds.  Pulmonary:     Breath sounds: Normal breath sounds.  Abdominal:     General: Abdomen is flat. Bowel sounds are normal.      Palpations: Abdomen is soft.     Tenderness: There is no abdominal tenderness.  Skin:    General: Skin is warm and dry.  Neurological:     Mental Status: She is alert.  Psychiatric:        Mood and Affect: Mood normal.        Behavior: Behavior normal.        Thought Content: Thought content normal.      Urgent Care Treatments / Results:   LABS: PLEASE NOTE: all labs that were ordered this encounter are listed, however only abnormal results are displayed. Labs Reviewed - No data to display  EKG: -None  RADIOLOGY: No results found.  PROCEDURES: Procedures  MEDICATIONS RECEIVED THIS VISIT: Medications - No data to display  PERTINENT CLINICAL COURSE NOTES/UPDATES:   Initial Impression / Assessment and Plan / Urgent Care Course:  Pertinent labs & imaging results that were available during my care of the patient were personally reviewed by me and considered in my medical decision making (see lab/imaging section of note for values and interpretations).  Jade Chen is a 27 y.o. female who presents to [facility] today with complaints of nasal congestion and diarrhea, diagnosed with allergies and diarrhea, and treated as such with the directions below. NP and patient reviewed discharge instructions below during visit.   Patient is well appearing overall in clinic today. She does not appear to be in any acute distress. Presenting symptoms (see HPI) and exam as documented above.   I have reviewed the follow up and strict return precautions for any new or worsening symptoms. Patient is aware of symptoms that would be deemed urgent/emergent, and would thus require further evaluation either here or in the emergency department. At the time of discharge, she verbalized understanding and consent with the discharge plan as it was reviewed with her. All questions were fielded by provider and/or clinic staff prior to patient discharge.    Final Clinical Impressions / Urgent Care  Diagnoses:   Final diagnoses:  Allergy, initial encounter  Diarrhea in adult patient    New Prescriptions:  [place] Controlled Substance Registry consulted? Not Applicable  No orders of the defined types were placed in this encounter.     Discharge Instructions      You were seen for diarrhea and nasal congestion and are being treated for allergies and diarrhea.   -Start taking antihistamine such as Zyrtec or Xyzal daily. -Also take Benadryl at nighttime if your symptoms are worsening at night and interfering with your sleep. -Use an intranasal steroid such as Flonase, 1 spray in each nostril twice a day. -A humidifier at nighttime may also be helpful. -Start an over-the-counter probiotic daily.  Some brand options are HUM nutrition and Love Wellness from [employer].  Take care, Dr. Sharlet Salina, NP-c  Recommended Follow up Care:  Patient encouraged to follow up with the following provider within the specified time frame, or sooner as dictated by the severity of her symptoms. As always, she was instructed that for any urgent/emergent care needs, she should seek care either here or in the emergency department for more immediate evaluation.   Bailey Mech, DNP, NP-c   Bailey Mech, NP 07/24/23 1245

## 2023-10-02 ENCOUNTER — Ambulatory Visit
Admission: EM | Admit: 2023-10-02 | Discharge: 2023-10-02 | Disposition: A | Discharge status: Home / Self Care | Payer: Managed Care, Other (non HMO) | Attending: Emergency Medicine | Admitting: Emergency Medicine

## 2023-10-02 DIAGNOSIS — G43709 Chronic migraine without aura, not intractable, without status migrainosus: Secondary | ICD-10-CM | POA: Diagnosis not present

## 2023-10-02 MED ORDER — NAPROXEN 500 MG PO TABS: 500.0000 mg | ORAL_TABLET | Freq: Two times a day (BID) | ORAL | 0 refills | Status: DC

## 2023-10-02 MED ORDER — ONDANSETRON 8 MG PO TBDP: ORAL_TABLET | ORAL | 0 refills | Status: DC

## 2023-10-02 MED ORDER — ACETAMINOPHEN 325 MG PO TABS
975.0000 mg | ORAL_TABLET | Freq: Once | ORAL | Status: AC
  Administered 2023-10-02: 975 mg via ORAL

## 2023-10-02 MED ORDER — ONDANSETRON 8 MG PO TBDP
8.0000 mg | ORAL_TABLET | Freq: Once | ORAL | Status: AC
  Administered 2023-10-02: 8 mg via ORAL

## 2023-10-02 MED ORDER — DEXAMETHASONE SODIUM PHOSPHATE 10 MG/ML IJ SOLN
10.0000 mg | Freq: Once | INTRAMUSCULAR | Status: AC
  Administered 2023-10-02: 10 mg via INTRAMUSCULAR

## 2023-10-02 MED ORDER — KETOROLAC TROMETHAMINE 30 MG/ML IJ SOLN
30.0000 mg | Freq: Once | INTRAMUSCULAR | Status: AC
  Administered 2023-10-02: 30 mg via INTRAMUSCULAR

## 2023-10-02 MED ORDER — BUTALBITAL-APAP-CAFFEINE 50-325-40 MG PO TABS
1.0000 | ORAL_TABLET | ORAL | 0 refills | Status: AC | PRN
Start: 2023-10-02 — End: ?

## 2023-10-02 NOTE — Discharge Instructions (Signed)
 Take the Naprosyn with 1000 mg of Tylenol twice a day.  You can take Imitrex and Zofran at the beginning of a migraine to help prevent it from getting worse.  Please follow-up with neurology as soon as you can.

## 2023-10-02 NOTE — ED Triage Notes (Signed)
 Pt is on a new hormonal medication for endometriosis and has not had a menstrual since September.

## 2023-10-02 NOTE — ED Triage Notes (Signed)
 Pt c/o headache x4days  Pt states that she has a history of migraines and she takes sumatriptan and Excedrin for the migraines.   Pt states that she had a migraine every day in the month of December and her headache worsened last week on Thursday.  Pt states that her headache has been intense and gradually increases in pain as the day goes on.  Pt asks if she is having a medication overuse headache due to increased use during December and asks if she should try a different medication for her headaches.

## 2023-10-02 NOTE — ED Provider Notes (Signed)
 HPI  SUBJECTIVE:  Jade Chen is a 28 y.o. female who reports a constant, daily gradual onset headache that alternates between the right and left sides for the past 4 days accompanied with nausea, photophobia, phonophobia.  Occasionally radiates down her jaw.  She does clench her teeth.  Symptoms are better with being in a dark, quiet room and with rest, worse with exposure to noise, light and with movement.  She was taking Imitrex and Excedrin, which usually resolves her headaches, but discontinued medications 2 days ago because she was concerned that her headaches were rebound from medication overuse.  She states that she has had migraine headaches every other day in December.  She is on Aygestin for endometriosis to stop her menstrual cycle since September and wonders if this could be contributing to her headaches.   No fevers, vomiting, visual changes, purulent nasal d/c, nasal congestion, sinus pain/pressure, ear pain,  dental pain,  dysarthria, focal weakness, facial droop, discoordination. No body aches, neck stiffness, rash. No mental status changes, seizures, syncope. No sudden onset, did not occur during exertion. States is similar to previous migraines, but this is different because it is not resolving with usual measures.  Past medical history negative for aneurysm, SAH/ICH, HIV, sinusitis, glaucoma, stroke, atrial fibrillation or temporal arteritis. Pt is not on any antiplatelets/anticoagulants.  LMP: September.  She had a negative home pregnancy test 2 days ago.  PCP: Madie Patty.  Neurology: [facility] in [place].  Past Medical History:  Diagnosis Date   Migraine     Past Surgical History:  Procedure Laterality Date   NO PAST SURGERIES      History reviewed. No pertinent family history.  Social History   Tobacco Use   Smoking status: Never   Smokeless tobacco: Never  Vaping Use   Vaping status: Former  Substance Use Topics   Alcohol use: Yes    Comment:  occasionally   Drug use: No    No current facility-administered medications for this encounter.  Current Outpatient Medications:    butalbital -acetaminophen -caffeine  (FIORICET) 50-325-40 MG tablet, Take 1-2 tablets by mouth every 4 (four) hours as needed for headache. Max 6 caps/day, Disp: 20 tablet, Rfl: 0   naproxen  (NAPROSYN ) 500 MG tablet, Take 1 tablet (500 mg total) by mouth 2 (two) times daily., Disp: 20 tablet, Rfl: 0   norethindrone (AYGESTIN) 5 MG tablet, Take by mouth., Disp: , Rfl:    ondansetron  (ZOFRAN -ODT) 8 MG disintegrating tablet, 1/2- 1 tablet q 8 hr prn nausea, vomiting, Disp: 20 tablet, Rfl: 0   SUMAtriptan (IMITREX) 50 MG tablet, Take by mouth., Disp: , Rfl:    hydrOXYzine  (ATARAX ) 25 MG tablet, Take 1 tablet (25 mg total) by mouth every 6 (six) hours., Disp: 30 tablet, Rfl: 1   norethindrone (MICRONOR) 0.35 MG tablet, Take 1 tablet by mouth daily., Disp: , Rfl:    triamcinolone  cream (KENALOG ) 0.1 %, Apply 1 Application topically 2 (two) times daily., Disp: 45 g, Rfl: 0  No Known Allergies   ROS  As noted in HPI.   Physical Exam  BP 117/80 (BP Location: Left Arm)   Pulse 82   Temp 98.9 F (37.2 C) (Oral)   Ht 5' 7 (1.702 m)   Wt 59 kg   LMP 05/29/2023   SpO2 99%   BMI 20.36 kg/m   Constitutional: Well developed, well nourished, no acute distress Eyes: PERRL, EOMI, conjunctiva normal bilaterally. Fundoscopic normal b/l. HENT: Normocephalic, atraumatic,mucus membranes moist, normal dentition.  TM  normal b/l. No TMJ tenderness. No nasal congestion, no sinus tenderness. No temporal artery tenderness.  Neck: no cervical LN . no trapezial muscle tenderness. No meningismus Respiratory: normal inspiratory effort Cardiovascular: Normal rate, regular rhythm GI:  nondistended skin: No rash, skin intact Musculoskeletal: No edema, no tenderness, no deformities Neurologic: Alert & oriented x 3, CN III-XII intact, romberg neg, finger-> nose, heel-> shin equal  b/l, Romberg neg, tandem gait steady Psychiatric: Speech and behavior appropriate   ED Course   Medications  acetaminophen  (TYLENOL ) tablet 975 mg (975 mg Oral Given 10/02/23 1153)  ketorolac  (TORADOL ) 30 MG/ML injection 30 mg (30 mg Intramuscular Given 10/02/23 1154)  ondansetron  (ZOFRAN -ODT) disintegrating tablet 8 mg (8 mg Oral Given 10/02/23 1153)  dexamethasone  (DECADRON ) injection 10 mg (10 mg Intramuscular Given 10/02/23 1154)    No orders of the defined types were placed in this encounter.  No results found for this or any previous visit (from the past 24 hours). No results found.   ED Clinical Impression  1. Chronic migraine without aura without status migrainosus, not intractable     ED Assessment/Plan     Pt describing typical pain, no sudden onset. Doubt SAH, ICH or space occupying lesion. Pt without fevers/chills, Pt has no meningeal sx, no nuchal rigidity. Doubt meningitis. Pt with normal neuro exam, no evidence of CVA/TIA.  Pt BP not elevated significantly, doubt hypertensive emergency. No evidence of temporal artery tenderness, no evidence of glaucoma or other ocular pathology. Will give headache cocktail (dexamethasone  10 IM, zofran  8 po, toradol  30 IM, Tylenol  975 mg)  Patient presents with recurrent migraines that are no longer responding to usual measures.  I suspect this could have something to do with the Aygestin that she is on, and possibly contributed to by medication overuse.    [place]  controlled substances registry for this patient consulted and feel the risk/benefit ratio today is favorable for proceeding with this prescription for a controlled substance.  Last opiate prescription  was a single short course of OxyContin in September.  Pt much improved after medications. Pt with continued non-focal neuro exam. Will send home with Zofran , Naprosyn /Tylenol , Fioricet if it becomes severe, and have her follow-up with neurology ASAP.  Strict ER return  precautions given. Discussed MDM, plan for follow up, signs and sx that should prompt return to ER. Pt agrees with plan  Meds ordered this encounter  Medications   acetaminophen  (TYLENOL ) tablet 975 mg   ketorolac  (TORADOL ) 30 MG/ML injection 30 mg   ondansetron  (ZOFRAN -ODT) disintegrating tablet 8 mg   dexamethasone  (DECADRON ) injection 10 mg   butalbital -acetaminophen -caffeine  (FIORICET) 50-325-40 MG tablet    Sig: Take 1-2 tablets by mouth every 4 (four) hours as needed for headache. Max 6 caps/day    Dispense:  20 tablet    Refill:  0   ondansetron  (ZOFRAN -ODT) 8 MG disintegrating tablet    Sig: 1/2- 1 tablet q 8 hr prn nausea, vomiting    Dispense:  20 tablet    Refill:  0   naproxen  (NAPROSYN ) 500 MG tablet    Sig: Take 1 tablet (500 mg total) by mouth 2 (two) times daily.    Dispense:  20 tablet    Refill:  0    *This clinic note was created using Scientist, clinical (histocompatibility and immunogenetics). Therefore, there may be occasional mistakes despite careful proofreading.  ?    Van Knee, MD 10/03/23 1231

## 2024-05-01 ENCOUNTER — Ambulatory Visit
Admission: RE | Admit: 2024-05-01 | Discharge: 2024-05-01 | Disposition: A | Discharge status: Home / Self Care | Source: Ambulatory Visit | Attending: Emergency Medicine | Admitting: Emergency Medicine

## 2024-05-01 VITALS — BP 133/81 | HR 65 | Temp 98.6°F | Resp 19

## 2024-05-01 DIAGNOSIS — J0141 Acute recurrent pansinusitis: Secondary | ICD-10-CM | POA: Diagnosis not present

## 2024-05-01 MED ORDER — PREDNISONE 20 MG PO TABS: 40.0000 mg | ORAL_TABLET | Freq: Every day | ORAL | 0 refills | Status: AC

## 2024-05-01 MED ORDER — IBUPROFEN 600 MG PO TABS: 600.0000 mg | ORAL_TABLET | Freq: Three times a day (TID) | ORAL | 0 refills | Status: DC | PRN

## 2024-05-01 MED ORDER — IPRATROPIUM BROMIDE 0.06 % NA SOLN: 2.0000 | Freq: Four times a day (QID) | NASAL | 0 refills | Status: DC

## 2024-05-01 MED ORDER — AMOXICILLIN-POT CLAVULANATE 875-125 MG PO TABS: 1.0000 | ORAL_TABLET | Freq: Two times a day (BID) | ORAL | 0 refills | Status: DC

## 2024-05-01 NOTE — ED Triage Notes (Addendum)
  Got sick two weeks ago. After one week, started feeling better. Then woke up two days later suddenly sick all over again. I think now what I have is a sinus infection that isn't improving on its own. 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 Husband was also positive for covid. Left ear pain that started today with left side of her throat hurting./

## 2024-05-01 NOTE — ED Provider Notes (Signed)
 HPI  SUBJECTIVE:  Jade Chen is a 28 y.o. female who presents with 16 days of nasal congestion, rhinorrhea that has now become dark yellow-brown, cough productive of dark yellow sputum, body aches, malaise, sinus pain and pressure, postnasal drip, sore throat.  She reports left ear pressure starting today.  No change in hearing, otorrhea, although she states her ears feel clogged.  No fevers, facial swelling, upper dental pain, wheezing, chest pain, shortness of breath, dyspnea on exertion.  No drooling, trismus, neck stiffness, voice changes, sensation of throat swelling shut.  She states that she originally got sick from a COVID-positive student, was sick for 8 days.  States that she got partially better, but then her symptoms returned 1 week ago, 2 days after she started feeling better.  No known strep exposure.  No antibiotics in the past month.  No antipyretic in the past 6 hours.  She has been taking Tylenol  1000 mg and Tylenol  sinus medication, Zyrtec.  The Tylenol  helps.  Symptoms are worse at night, particularly the cough.  LMP: September 2024 she is on continuous OCPs.  She denies the possibility of being pregnant.  Past medical history of endometriosis, migraines, seasonal allergies, she is allergic to her new cat.  She states this is her 3rd or 4th sinus infection that she has had this year.  PCP: Madie Patty.    Past Medical History:  Diagnosis Date   Migraine     Past Surgical History:  Procedure Laterality Date   NO PAST SURGERIES      History reviewed. No pertinent family history.  Social History   Tobacco Use   Smoking status: Never   Smokeless tobacco: Never  Vaping Use   Vaping status: Former  Substance Use Topics   Alcohol use: Yes    Comment: occasionally   Drug use: No    No current facility-administered medications for this encounter.  Current Outpatient Medications:    amoxicillin -clavulanate (AUGMENTIN ) 875-125 MG tablet, Take 1 tablet by  mouth every 12 (twelve) hours., Disp: 14 tablet, Rfl: 0   ibuprofen  (ADVIL ) 600 MG tablet, Take 1 tablet (600 mg total) by mouth every 8 (eight) hours as needed., Disp: 30 tablet, Rfl: 0   ipratropium (ATROVENT ) 0.06 % nasal spray, Place 2 sprays into both nostrils 4 (four) times daily., Disp: 15 mL, Rfl: 0   norethindrone (AYGESTIN) 5 MG tablet, Take by mouth., Disp: , Rfl:    nortriptyline (PAMELOR) 10 MG capsule, -Start nortriptyline 10 mg at night for one week then increase to 20 mg at night for migraines, Disp: , Rfl:    predniSONE  (DELTASONE ) 20 MG tablet, Take 2 tablets (40 mg total) by mouth daily with breakfast for 5 days., Disp: 10 tablet, Rfl: 0   SUMAtriptan (IMITREX) 50 MG tablet, Take by mouth., Disp: , Rfl:    butalbital -acetaminophen -caffeine  (FIORICET) 50-325-40 MG tablet, Take 1-2 tablets by mouth every 4 (four) hours as needed for headache. Max 6 caps/day, Disp: 20 tablet, Rfl: 0   norethindrone (MICRONOR) 0.35 MG tablet, Take 1 tablet by mouth daily., Disp: , Rfl:    ondansetron  (ZOFRAN -ODT) 8 MG disintegrating tablet, 1/2- 1 tablet q 8 hr prn nausea, vomiting, Disp: 20 tablet, Rfl: 0  No Known Allergies   ROS  As noted in HPI.   Physical Exam  BP 133/81 (BP Location: Left Arm)   Pulse 65   Temp 98.6 F (37 C) (Oral)   Resp 19   SpO2 100%   Constitutional: Well  developed, well nourished, no acute distress Eyes: PERRL, EOMI, conjunctiva normal bilaterally HENT: Normocephalic, atraumatic,mucus membranes moist. left ear: External ear, EAC, TM normal.  No pain with traction on pinna palpation of tragus or mastoid.  Right TM normal.  Purulent nasal congestion.  Erythematous, swollen turbinates.  No maxillary, frontal sinus tenderness.  Extensive postnasal drip. Neck: No cervical lymphadenopathy Respiratory: Clear to auscultation bilaterally, no rales, no wheezing, no rhonchi.  No anterior, lateral chest wall tenderness Cardiovascular: Normal rate and rhythm, no murmurs,  no gallops, no rubs GI: nondistended skin: No rash, skin intact Musculoskeletal: no deformities Neurologic: Alert & oriented x 3, CN III-XII grossly intact, no motor deficits, sensation grossly intact Psychiatric: Speech and behavior appropriate   ED Course   Medications - No data to display  Orders Placed This Encounter  Procedures   Ambulatory referral to ENT    Referral Priority:   Routine    Referral Type:   Consultation    Referral Reason:   Specialty Services Required    Requested Specialty:   Otolaryngology    Number of Visits Requested:   1   No results found for this or any previous visit (from the past 24 hours). No results found.  ED Clinical Impression  1. Acute recurrent pansinusitis      ED Assessment/Plan     Patient presents with acute recurrent sinusitis.  He qualifies for antibiotics due to double sickening and duration of symptoms.  Home with Augmentin  875 mg p.o. twice daily for 7 days, prednisone  40 mg for 5 days, Mucinex D, discontinue Zyrtec.  Saline nasal irrigation, Atrovent  nasal spray.  Promethazine  DM as needed for cough.  600 mg of ibuprofen  combined with 1000 mg of Tylenol  3-4 times a day as needed for pain, will refer to ENT.  Deferring chest x-ray today as she has no focal lung findings, satting 100% on room air, I suspect that her cough is from the extensive postnasal drip.  Placing referral to ENT since this is a recurrent issue.  Discussed  MDM, treatment plan, and plan for follow-up with patient.  Discussed sn/sx that should prompt return to the ED. patient agrees with plan.   Meds ordered this encounter  Medications   amoxicillin -clavulanate (AUGMENTIN ) 875-125 MG tablet    Sig: Take 1 tablet by mouth every 12 (twelve) hours.    Dispense:  14 tablet    Refill:  0   ipratropium (ATROVENT ) 0.06 % nasal spray    Sig: Place 2 sprays into both nostrils 4 (four) times daily.    Dispense:  15 mL    Refill:  0   predniSONE  (DELTASONE ) 20  MG tablet    Sig: Take 2 tablets (40 mg total) by mouth daily with breakfast for 5 days.    Dispense:  10 tablet    Refill:  0   ibuprofen  (ADVIL ) 600 MG tablet    Sig: Take 1 tablet (600 mg total) by mouth every 8 (eight) hours as needed.    Dispense:  30 tablet    Refill:  0      *This clinic note was created using Scientist, clinical (histocompatibility and immunogenetics). Therefore, there may be occasional mistakes despite careful proofreading. ?    Van Knee, MD 05/01/24 1810

## 2024-05-01 NOTE — Discharge Instructions (Signed)
 Start Mucinex-D to keep the mucous thin and to decongest you.  Stop the Zyrtec.  You may take 600 mg of motrin  with 1000 mg of tylenol  up to 3-4 times a day as needed for pain. This is an effective combination for pain.  Use a [employer] sinus rinse with distilled water as often as you want to to reduce nasal congestion. Follow the directions on the box.  Atrovent  nasal spray for the postnasal drip.  Promethazine  DM for cough.  Finish the Augmentin  and the prednisone , even if you feel better.  Follow-up with [facility] since this is a recurrent issue.  Go to www.goodrx.com to look up your medications. This will give you a list of where you can find your prescriptions at the most affordable prices. Or you can ask the pharmacist what the cash price is. This is frequently cheaper than going through insurance.

## 2024-07-01 ENCOUNTER — Telehealth: Admitting: Family

## 2024-07-01 DIAGNOSIS — J019 Acute sinusitis, unspecified: Secondary | ICD-10-CM | POA: Diagnosis not present

## 2024-07-01 MED ORDER — FLUTICASONE PROPIONATE 50 MCG/ACT NA SUSP
2.0000 | Freq: Every day | NASAL | 6 refills | Status: AC
Start: 2024-07-01 — End: ?

## 2024-07-01 MED ORDER — CETIRIZINE HCL 10 MG PO TABS
10.0000 mg | ORAL_TABLET | Freq: Every day | ORAL | 1 refills | Status: AC
Start: 2024-07-01 — End: ?

## 2024-07-01 NOTE — Progress Notes (Signed)
 E-Visit for Sinus Problems  We are sorry that you are not feeling well.  Here is how we plan to help!  Based on what you have shared with me it looks like you have sinusitis.  Sinusitis is inflammation and infection in the sinus cavities of the head.  Based on your presentation I believe you most likely have Acute Bacterial Sinusitis.  This is an infection caused by bacteria and is treated with antibiotics. I have prescribed I have also prescribed Flonase Nasal Spray Use 2 sprays in each nostril daily for 10-14 days and Cetirizine 10mg  Take 1 tablet daily You may use an oral decongestant such as Mucinex D or if you have glaucoma or high blood pressure use plain Mucinex. Saline nasal spray help and can safely be used as often as needed for congestion.  If you develop worsening sinus pain, fever or notice severe headache and vision changes, or if symptoms are not better after completion of antibiotic, please schedule an appointment with a health care provider.    Sinus infections are not as easily transmitted as other respiratory infection, however we still recommend that you avoid close contact with loved ones, especially the very young and elderly.  Remember to wash your hands thoroughly throughout the day as this is the number one way to prevent the spread of infection!  Home Care: Only take medications as instructed by your medical team. Complete the entire course of an antibiotic. Do not take these medications with alcohol. A steam or ultrasonic humidifier can help congestion.  You can place a towel over your head and breathe in the steam from hot water coming from a faucet. Avoid close contacts especially the very young and the elderly. Cover your mouth when you cough or sneeze. Always remember to wash your hands.  Get Help Right Away If: You develop worsening fever or sinus pain. You develop a severe head ache or visual changes. Your symptoms persist after you have completed your  treatment plan.  Make sure you Understand these instructions. Will watch your condition. Will get help right away if you are not doing well or get worse.  Your e-visit answers were reviewed by a board certified advanced clinical practitioner to complete your personal care plan.  Depending on the condition, your plan could have included both over the counter or prescription medications.  If there is a problem please reply  once you have received a response from your provider.  Your safety is important to us .  If you have drug allergies check your prescription carefully.    You can use MyChart to ask questions about today's visit, request a non-urgent call back, or ask for a work or school excuse for 24 hours related to this e-Visit. If it has been greater than 24 hours you will need to follow up with your provider, or enter a new e-Visit to address those concerns.  You will get an e-mail in the next two days asking about your experience.  I hope that your e-visit has been valuable and will speed your recovery. Thank you for using e-visits.  I have spent 5 minutes in review of e-visit questionnaire, review and updating patient chart, medical decision making and response to patient.   Bari Learn, FNP

## 2024-07-12 ENCOUNTER — Telehealth: Admitting: Physician Assistant

## 2024-07-12 DIAGNOSIS — R3989 Other symptoms and signs involving the genitourinary system: Secondary | ICD-10-CM

## 2024-07-13 MED ORDER — NITROFURANTOIN MONOHYD MACRO 100 MG PO CAPS
100.0000 mg | ORAL_CAPSULE | Freq: Two times a day (BID) | ORAL | 0 refills | Status: DC
Start: 2024-07-13 — End: 2024-08-01

## 2024-07-13 NOTE — Progress Notes (Signed)

## 2024-08-01 ENCOUNTER — Ambulatory Visit
Admission: EM | Admit: 2024-08-01 | Discharge: 2024-08-01 | Disposition: A | Discharge status: Home / Self Care | Attending: Emergency Medicine | Admitting: Emergency Medicine

## 2024-08-01 ENCOUNTER — Encounter: Payer: Self-pay | Admitting: Emergency Medicine

## 2024-08-01 DIAGNOSIS — N390 Urinary tract infection, site not specified: Secondary | ICD-10-CM | POA: Diagnosis not present

## 2024-08-01 DIAGNOSIS — R300 Dysuria: Secondary | ICD-10-CM | POA: Diagnosis not present

## 2024-08-01 DIAGNOSIS — R319 Hematuria, unspecified: Secondary | ICD-10-CM | POA: Insufficient documentation

## 2024-08-01 LAB — POCT URINE DIPSTICK
Glucose, UA: NEGATIVE mg/dL
Nitrite, UA: NEGATIVE
Protein Ur, POC: >=300 mg/dL — AB
Spec Grav, UA: 1.025 (ref 1.010–1.025)
Urobilinogen, UA: 1 U/dL
pH, UA: 6 (ref 5.0–8.0)

## 2024-08-01 MED ORDER — CIPROFLOXACIN HCL 500 MG PO TABS: 500.0000 mg | ORAL_TABLET | Freq: Two times a day (BID) | ORAL | 0 refills | Status: AC

## 2024-08-01 MED ORDER — PHENAZOPYRIDINE HCL 200 MG PO TABS: 200.0000 mg | ORAL_TABLET | Freq: Three times a day (TID) | ORAL | 0 refills | Status: AC

## 2024-08-01 NOTE — ED Provider Notes (Addendum)
 [facility]    CSN: [id number] Arrival date & time: 08/01/24  1928      History   Chief Complaint Chief Complaint  Patient presents with   Dysuria   Urinary Frequency   Flank Pain   Hematuria    HPI Jade Chen is a 28 y.o. female.   HPI  28 year old female with past medical history significant for migraine headaches, tubo-ovarian abscess, ovarian torsion, and pelvic pain in the female presents for evaluation of UTI symptoms which started earlier today and include painful urination, urinary urgency and frequency, hematuria, and left flank pain.  She also endorses some mild suprapubic pain.  No nausea or vomiting or low back pain.  Past Medical History:  Diagnosis Date   Migraine     Patient Active Problem List   Diagnosis Date Noted   Pelvic pain in female 06/21/2023   Ovarian torsion 06/04/2023   TOA (tubo-ovarian abscess) 03/25/2023   Migraine without aura and without status migrainosus, not intractable 04/14/2021    Past Surgical History:  Procedure Laterality Date   NO PAST SURGERIES      OB History   No obstetric history on file.      Home Medications    Prior to Admission medications   Medication Sig Start Date End Date Taking? Authorizing Provider  ciprofloxacin (CIPRO) 500 MG tablet Take 1 tablet (500 mg total) by mouth 2 (two) times daily for 7 days. 08/01/24 08/08/24 Yes Bernardino Ditch, NP  norethindrone (AYGESTIN) 5 MG tablet Take by mouth. 06/20/23  Yes [provider]  phenazopyridine  (PYRIDIUM ) 200 MG tablet Take 1 tablet (200 mg total) by mouth 3 (three) times daily. 08/01/24  Yes Bernardino Ditch, NP  SUMAtriptan (IMITREX) 50 MG tablet Take by mouth.   Yes [provider]    Family History History reviewed. No pertinent family history.  Social History Social History   Tobacco Use   Smoking status: Never   Smokeless tobacco: Never  Vaping Use   Vaping status: Former  Substance Use Topics   Alcohol use: Yes     Comment: occasionally   Drug use: No     Allergies   Patient has no known allergies.   Review of Systems Review of Systems  Constitutional:  Positive for fever.  Gastrointestinal:  Positive for abdominal pain.       Suprapubic pain.  Genitourinary:  Positive for dysuria, frequency, hematuria and urgency.  Musculoskeletal:  Negative for back pain.     Physical Exam Triage Vital Signs ED Triage Vitals  Encounter Vitals Group     BP      Girls Systolic BP Percentile      Girls Diastolic BP Percentile      Boys Systolic BP Percentile      Boys Diastolic BP Percentile      Pulse      Resp      Temp      Temp src      SpO2      Weight      Height      Head Circumference      Peak Flow      Pain Score      Pain Loc      Pain Education      Exclude from Growth Chart    No data found.  Updated Vital Signs BP 130/73 (BP Location: Left Arm)   Pulse 93   Temp 97.7 F (36.5 C) (Oral)   Resp  16   Wt 150 lb (68 kg)   SpO2 98%   BMI 23.49 kg/m   Visual Acuity Right Eye Distance:   Left Eye Distance:   Bilateral Distance:    Right Eye Near:   Left Eye Near:    Bilateral Near:     Physical Exam Vitals and nursing note reviewed.  Constitutional:      Appearance: Normal appearance. She is not ill-appearing.  HENT:     Head: Normocephalic and atraumatic.  Cardiovascular:     Rate and Rhythm: Normal rate and regular rhythm.     Pulses: Normal pulses.     Heart sounds: Normal heart sounds. No murmur heard.    No friction rub. No gallop.  Pulmonary:     Effort: Pulmonary effort is normal.     Breath sounds: Normal breath sounds. No wheezing, rhonchi or rales.  Abdominal:     General: Abdomen is flat.     Palpations: Abdomen is soft.     Tenderness: There is abdominal tenderness. There is no right CVA tenderness or left CVA tenderness.     Comments: Mild suprapubic tenderness.  Skin:    General: Skin is warm and dry.     Capillary Refill: Capillary  refill takes less than 2 seconds.     Findings: No rash.  Neurological:     General: No focal deficit present.     Mental Status: She is alert and oriented to person, place, and time.      UC Treatments / Results  Labs (all labs ordered are listed, but only abnormal results are displayed) Labs Reviewed  POCT URINE DIPSTICK - Abnormal; Notable for the following components:      Result Value   Color, UA red (*)    Clarity, UA cloudy (*)    Bilirubin, UA small (*)    Ketones, POC UA large (80) (*)    Blood, UA large (*)    Protein Ur, POC >=300 (*)    Leukocytes, UA Small (1+) (*)    All other components within normal limits  URINE CULTURE    EKG   Radiology No results found.  Procedures Procedures (including critical care time)  Medications Ordered in UC Medications - No data to display  Initial Impression / Assessment and Plan / UC Course  I have reviewed the triage vital signs and the nursing notes.  Pertinent labs & imaging results that were available during my care of the patient were reviewed by me and considered in my medical decision making (see chart for details).   Patient is a pleasant, nontoxic-appearing 28 year old female presenting for evaluation of UTI symptoms outlined HPI above.  She reports that she had an e-visit for UTI symptoms 2 weeks ago and was prescribed Macrobid .  She reports that her symptoms resolved with the antibiotics.  Today she had a recurrence of symptoms.  She also reports that 2 days ago she had a low-grade fever but she is also been experiencing some upper respiratory symptoms in the meantime.  She has no CVA tenderness on exam.  I will order a urinalysis to assess for the presence of UTI.  Urinalysis is red in color with a cloudy appearance, small bilirubin, large ketones, large red blood cells, greater than 300 protein, small leukocyte esterase, nitrite negative.  I will send urine for culture.  Urine culture returned with greater  than 50,000 colony-forming units of E. coli.  Susceptibilities pending.  Urine culture came back pansensitive.  No  change in antibiotic therapy at this time.  I will discharge patient on the diagnosis of UTI and start her on Cipro 500 mg twice daily for 7 days along with Pyridium  200 mg every 8 hours as needed for urinary discomfort pending the urine culture.  Return precautions reviewed.   Final Clinical Impressions(s) / UC Diagnoses   Final diagnoses:  Dysuria  Urinary tract infection with hematuria, site unspecified     Discharge Instructions      Take the Cipro twice daily for 7 days with food for treatment of urinary tract infection.  Use the Pyridium  every 8 hours as needed for urinary discomfort.  This will turn your urine a bright red-orange.  Increase your oral fluid intake so that you increase your urine production and or flushing your urinary system.  Take an over-the-counter probiotic, such as Culturelle-Align-Activia, 1 hour after each dose of antibiotic to prevent diarrhea or yeast infections from forming.  We will culture urine and change the antibiotics if necessary.  Return for reevaluation, or see your primary care provider, for any new or worsening symptoms.      ED Prescriptions     Medication Sig Dispense Auth. Provider   ciprofloxacin (CIPRO) 500 MG tablet Take 1 tablet (500 mg total) by mouth 2 (two) times daily for 7 days. 14 tablet Bernardino Ditch, NP   phenazopyridine  (PYRIDIUM ) 200 MG tablet Take 1 tablet (200 mg total) by mouth 3 (three) times daily. 6 tablet Bernardino Ditch, NP      PDMP not reviewed this encounter.   Bernardino Ditch, NP 08/01/24 2007    Bernardino Ditch, NP 08/04/24 [id number]    Bernardino Ditch, NP 08/04/24 [phone]

## 2024-08-01 NOTE — Discharge Instructions (Addendum)
Take the Cipro twice daily for 7 days with food for treatment of urinary tract infection.  Use the Pyridium every 8 hours as needed for urinary discomfort.  This will turn your urine a bright red-orange.  Increase your oral fluid intake so that you increase your urine production and or flushing your urinary system.  Take an over-the-counter probiotic, such as Culturelle-Align-Activia, 1 hour after each dose of antibiotic to prevent diarrhea or yeast infections from forming.  We will culture urine and change the antibiotics if necessary.  Return for reevaluation, or see your primary care provider, for any new or worsening symptoms.

## 2024-08-01 NOTE — ED Triage Notes (Addendum)
 Pt presents with dysuria, urinary frequency, hematuria and flank pain started today. Pt was treated for a UTI 2 weeks ago with macrobid  through a virtual visit.

## 2024-08-04 ENCOUNTER — Encounter: Payer: Self-pay | Admitting: Emergency Medicine

## 2024-08-04 LAB — URINE CULTURE: Culture: 50000 — AB

## 2024-08-06 ENCOUNTER — Ambulatory Visit (HOSPITAL_COMMUNITY): Payer: Self-pay
